# Patient Record
Sex: Male | Born: 1960 | Race: White | Hispanic: No | Marital: Single
Health system: Southern US, Academic
[De-identification: ages and names within clinical notes are randomized; demographics above are authoritative.]

## PROBLEM LIST (undated history)

## (undated) ENCOUNTER — Ambulatory Visit

## (undated) ENCOUNTER — Ambulatory Visit: Payer: PRIVATE HEALTH INSURANCE | Attending: Hematology & Oncology | Primary: Hematology & Oncology

## (undated) ENCOUNTER — Encounter

## (undated) ENCOUNTER — Encounter: Attending: Hematology & Oncology | Primary: Hematology & Oncology

## (undated) ENCOUNTER — Telehealth

## (undated) ENCOUNTER — Ambulatory Visit: Payer: PRIVATE HEALTH INSURANCE

## (undated) ENCOUNTER — Encounter: Payer: PRIVATE HEALTH INSURANCE | Attending: Hematology & Oncology | Primary: Hematology & Oncology

## (undated) ENCOUNTER — Ambulatory Visit
Attending: Pharmacist Clinician (PhC)/ Clinical Pharmacy Specialist | Primary: Pharmacist Clinician (PhC)/ Clinical Pharmacy Specialist

## (undated) DIAGNOSIS — F419 Anxiety disorder, unspecified: Secondary | ICD-10-CM

## (undated) DIAGNOSIS — G4730 Sleep apnea, unspecified: Secondary | ICD-10-CM

## (undated) DIAGNOSIS — K7460 Unspecified cirrhosis of liver: Secondary | ICD-10-CM

## (undated) DIAGNOSIS — E119 Type 2 diabetes mellitus without complications: Secondary | ICD-10-CM

## (undated) DIAGNOSIS — F1910 Other psychoactive substance abuse, uncomplicated: Secondary | ICD-10-CM

## (undated) DIAGNOSIS — I10 Essential (primary) hypertension: Secondary | ICD-10-CM

## (undated) DIAGNOSIS — D649 Anemia, unspecified: Secondary | ICD-10-CM

## (undated) DIAGNOSIS — E079 Disorder of thyroid, unspecified: Secondary | ICD-10-CM

## (undated) DIAGNOSIS — M1990 Unspecified osteoarthritis, unspecified site: Secondary | ICD-10-CM

## (undated) DIAGNOSIS — K759 Inflammatory liver disease, unspecified: Secondary | ICD-10-CM

## (undated) DIAGNOSIS — E039 Hypothyroidism, unspecified: Secondary | ICD-10-CM

## (undated) DIAGNOSIS — I619 Nontraumatic intracerebral hemorrhage, unspecified: Secondary | ICD-10-CM

## (undated) DIAGNOSIS — C801 Malignant (primary) neoplasm, unspecified: Secondary | ICD-10-CM

## (undated) DIAGNOSIS — F32A Depression, unspecified: Secondary | ICD-10-CM

## (undated) DIAGNOSIS — R7303 Prediabetes: Secondary | ICD-10-CM

## (undated) HISTORY — PX: HERNIA REPAIR: SHX51

## (undated) HISTORY — PX: ANKLE SURGERY: SHX546

## (undated) HISTORY — PX: BACK SURGERY: SHX140

---
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
## 2015-11-08 ENCOUNTER — Encounter (HOSPITAL_COMMUNITY): Payer: Self-pay | Admitting: Emergency Medicine

## 2015-11-08 ENCOUNTER — Emergency Department (HOSPITAL_COMMUNITY)
Admission: EM | Admit: 2015-11-08 | Discharge: 2015-11-09 | Disposition: A | Discharge status: Home / Self Care | Payer: Self-pay | Attending: Emergency Medicine | Admitting: Emergency Medicine

## 2015-11-08 DIAGNOSIS — R140 Abdominal distension (gaseous): Secondary | ICD-10-CM | POA: Insufficient documentation

## 2015-11-08 DIAGNOSIS — R109 Unspecified abdominal pain: Secondary | ICD-10-CM

## 2015-11-08 DIAGNOSIS — Z720 Tobacco use: Secondary | ICD-10-CM | POA: Insufficient documentation

## 2015-11-08 DIAGNOSIS — K429 Umbilical hernia without obstruction or gangrene: Secondary | ICD-10-CM | POA: Insufficient documentation

## 2015-11-08 DIAGNOSIS — I10 Essential (primary) hypertension: Secondary | ICD-10-CM | POA: Insufficient documentation

## 2015-11-08 DIAGNOSIS — Z79899 Other long term (current) drug therapy: Secondary | ICD-10-CM | POA: Insufficient documentation

## 2015-11-08 DIAGNOSIS — Z791 Long term (current) use of non-steroidal anti-inflammatories (NSAID): Secondary | ICD-10-CM | POA: Insufficient documentation

## 2015-11-08 DIAGNOSIS — E079 Disorder of thyroid, unspecified: Secondary | ICD-10-CM | POA: Insufficient documentation

## 2015-11-08 DIAGNOSIS — E119 Type 2 diabetes mellitus without complications: Secondary | ICD-10-CM | POA: Insufficient documentation

## 2015-11-08 DIAGNOSIS — K7460 Unspecified cirrhosis of liver: Secondary | ICD-10-CM | POA: Insufficient documentation

## 2015-11-08 DIAGNOSIS — R110 Nausea: Secondary | ICD-10-CM

## 2015-11-08 DIAGNOSIS — R112 Nausea with vomiting, unspecified: Secondary | ICD-10-CM | POA: Insufficient documentation

## 2015-11-08 DIAGNOSIS — R1030 Lower abdominal pain, unspecified: Secondary | ICD-10-CM | POA: Insufficient documentation

## 2015-11-08 HISTORY — DX: Disorder of thyroid, unspecified: E07.9

## 2015-11-08 HISTORY — DX: Type 2 diabetes mellitus without complications: E11.9

## 2015-11-08 HISTORY — DX: Essential (primary) hypertension: I10

## 2015-11-08 LAB — CBC
HCT: 29.4 % — ABNORMAL LOW (ref 39.0–52.0)
Hemoglobin: 9.6 g/dL — ABNORMAL LOW (ref 13.0–17.0)
MCH: 30.1 pg (ref 26.0–34.0)
MCHC: 32.7 g/dL (ref 30.0–36.0)
MCV: 92.2 fL (ref 78.0–100.0)
Platelets: 113 10*3/uL — ABNORMAL LOW (ref 150–400)
RBC: 3.19 MIL/uL — ABNORMAL LOW (ref 4.22–5.81)
RDW: 14.5 % (ref 11.5–15.5)
WBC: 4.2 10*3/uL (ref 4.0–10.5)

## 2015-11-08 LAB — URINALYSIS, ROUTINE W REFLEX MICROSCOPIC
Glucose, UA: NEGATIVE mg/dL
Hgb urine dipstick: NEGATIVE
KETONES UR: 15 mg/dL — AB
LEUKOCYTES UA: NEGATIVE
NITRITE: NEGATIVE
PROTEIN: NEGATIVE mg/dL
Specific Gravity, Urine: 1.028 (ref 1.005–1.030)
Urobilinogen, UA: 1 mg/dL (ref 0.0–1.0)
pH: 5.5 (ref 5.0–8.0)

## 2015-11-08 LAB — COMPREHENSIVE METABOLIC PANEL WITH GFR
ALT: 51 U/L (ref 17–63)
AST: 56 U/L — ABNORMAL HIGH (ref 15–41)
Albumin: 3.5 g/dL (ref 3.5–5.0)
Alkaline Phosphatase: 61 U/L (ref 38–126)
Anion gap: 6 (ref 5–15)
BUN: 30 mg/dL — ABNORMAL HIGH (ref 6–20)
CO2: 25 mmol/L (ref 22–32)
Calcium: 8.4 mg/dL — ABNORMAL LOW (ref 8.9–10.3)
Chloride: 105 mmol/L (ref 101–111)
Creatinine, Ser: 1.65 mg/dL — ABNORMAL HIGH (ref 0.61–1.24)
GFR calc Af Amer: 54 mL/min — ABNORMAL LOW
GFR calc non Af Amer: 46 mL/min — ABNORMAL LOW
Glucose, Bld: 167 mg/dL — ABNORMAL HIGH (ref 65–99)
Potassium: 4.3 mmol/L (ref 3.5–5.1)
Sodium: 136 mmol/L (ref 135–145)
Total Bilirubin: 0.7 mg/dL (ref 0.3–1.2)
Total Protein: 7.6 g/dL (ref 6.5–8.1)

## 2015-11-08 LAB — LIPASE, BLOOD: LIPASE: 59 U/L — AB (ref 11–51)

## 2015-11-08 MED ORDER — ONDANSETRON HCL 4 MG/2ML IJ SOLN
4.0000 mg | Freq: Once | INTRAMUSCULAR | Status: AC
  Administered 2015-11-08: 4 mg via INTRAVENOUS
  Filled 2015-11-08: qty 2

## 2015-11-08 MED ORDER — FENTANYL CITRATE (PF) 100 MCG/2ML IJ SOLN
50.0000 ug | Freq: Once | INTRAMUSCULAR | Status: AC
Start: 2015-11-08 — End: 2015-11-08
  Administered 2015-11-08: 50 ug via INTRAVENOUS
  Filled 2015-11-08: qty 2

## 2015-11-08 MED ORDER — SODIUM CHLORIDE 0.9 % IV BOLUS (SEPSIS)
1000.0000 mL | Freq: Once | INTRAVENOUS | Status: AC
  Administered 2015-11-09: 1000 mL via INTRAVENOUS

## 2015-11-08 MED ORDER — MORPHINE SULFATE (PF) 4 MG/ML IV SOLN
4.0000 mg | INTRAVENOUS | Status: DC | PRN
  Administered 2015-11-09 (×2): 4 mg via INTRAVENOUS
  Filled 2015-11-08: qty 1

## 2015-11-08 MED ORDER — ONDANSETRON HCL 4 MG/2ML IJ SOLN
4.0000 mg | Freq: Four times a day (QID) | INTRAMUSCULAR | Status: DC | PRN
  Administered 2015-11-09 (×2): 4 mg via INTRAVENOUS
  Filled 2015-11-08 (×2): qty 2

## 2015-11-08 NOTE — ED Notes (Signed)
MD at bedside. 

## 2015-11-08 NOTE — ED Provider Notes (Signed)
CSN: [id number]   Arrival date & time 11/08/15 2141  History  By signing my name below, I, Altamease Oiler, attest that this documentation has been prepared under the direction and in the presence of Jola Schmidt, MD. Electronically Signed: Altamease Oiler, ED Scribe. 11/08/2015. 11:59 PM.  Chief Complaint  Patient presents with  . Abdominal Pain    HPI The history is provided by the patient. No language interpreter was used.   David Griffin is a 54 y.o. male with history of DM, HTN, and thyroid disease who presents to the Emergency Department complaining of new and intermittent lower abdominal pain with onset 1 month ago. He describes the pain as "almost like gas pain" but it is not improved after passing gas. The pain is worse with eating and bending at the waist.  Associated symptoms include abdominal bloating that is worse with eating, nausea, 1 episode of emesis today, and a "knot" near the umbilicus. His bowel movements have been regular but he states that it feels as if he does not complete them. Pt denies diarrhea, fever, weight loss. No history of anemia. Pt drinks beer daily.   Past Medical History  Diagnosis Date  . Hypertension   . Diabetes mellitus without complication ([place])   . Thyroid disease     Past Surgical History  Procedure Laterality Date  . Back surgery    . Ankle surgery      No family history on file.  Social History  Substance Use Topics  . Smoking status: Current Every Day Smoker  . Smokeless tobacco: None  . Alcohol Use: Yes     Review of Systems 10 Systems reviewed and all are negative for acute change except as noted in the HPI. Home Medications   Prior to Admission medications   Medication Sig Start Date End Date Taking? Authorizing Provider  ALPRAZolam Duanne Moron) 1 MG tablet Take 1 mg by mouth 2 (two) times daily as needed for anxiety.   Yes Historical Provider, MD  HYDROcodone-acetaminophen (NORCO) 10-325 MG tablet Take 0.5 tablets by mouth  daily as needed for moderate pain.   Yes Historical Provider, MD  levothyroxine (SYNTHROID, LEVOTHROID) 300 MCG tablet Take 300 mcg by mouth daily before breakfast.   Yes Historical Provider, MD  lisinopril-hydrochlorothiazide (PRINZIDE,ZESTORETIC) 20-12.5 MG tablet Take 1 tablet by mouth 2 (two) times daily.   Yes Historical Provider, MD  MAGNESIUM PO Take 1 tablet by mouth daily.   Yes Historical Provider, MD  metFORMIN (GLUCOPHAGE) 500 MG tablet Take 1,000 mg by mouth 2 (two) times daily with a meal.   Yes Historical Provider, MD  Multiple Vitamin (MULTIVITAMIN WITH MINERALS) TABS tablet Take 1 tablet by mouth daily.   Yes Historical Provider, MD  naproxen (NAPROSYN) 500 MG tablet Take 500 mg by mouth 2 (two) times daily with a meal.   Yes Historical Provider, MD  RaNITidine HCl (ACID CONTROL PO) Take 1 tablet by mouth daily.   Yes Historical Provider, MD    Allergies  Review of patient's allergies indicates no known allergies.  Triage Vitals: BP 120/67 mmHg  Pulse 87  Temp(Src) 98.7 F (37.1 C) (Oral)  Resp 16  SpO2 97%  Physical Exam  Constitutional: He is oriented to person, place, and time. He appears well-developed and well-nourished.  HENT:  Head: Normocephalic and atraumatic.  Eyes: EOM are normal.  Neck: Normal range of motion.  Cardiovascular: Normal rate, regular rhythm, normal heart sounds and intact distal pulses.   Pulmonary/Chest: Effort normal and breath  sounds normal. No respiratory distress.  Abdominal: Soft. There is no rebound and no guarding.  Mild lower abdominal tenderness Small reducible umbilical hernia  Musculoskeletal: Normal range of motion.  Neurological: He is alert and oriented to person, place, and time.  Skin: Skin is warm and dry.  Psychiatric: He has a normal mood and affect. Judgment normal.  Nursing note and vitals reviewed.   ED Course  Procedures   DIAGNOSTIC STUDIES: Oxygen Saturation is 97% on RA, normal by my interpretation.     COORDINATION OF CARE: 11:58 PM Discussed treatment plan which includes lab work, CT A/P, IVF, Zofran, and pain management with pt at bedside and pt agreed to plan.  Labs Reviewed  LIPASE, BLOOD - Abnormal; Notable for the following:    Lipase 59 (*)    All other components within normal limits  COMPREHENSIVE METABOLIC PANEL - Abnormal; Notable for the following:    Glucose, Bld 167 (*)    BUN 30 (*)    Creatinine, Ser 1.65 (*)    Calcium 8.4 (*)    AST 56 (*)    GFR calc non Af Amer 46 (*)    GFR calc Af Amer 54 (*)    All other components within normal limits  CBC - Abnormal; Notable for the following:    RBC 3.19 (*)    Hemoglobin 9.6 (*)    HCT 29.4 (*)    Platelets 113 (*)    All other components within normal limits  URINALYSIS, ROUTINE W REFLEX MICROSCOPIC (NOT AT [facility]) - Abnormal; Notable for the following:    Color, Urine AMBER (*)    Bilirubin Urine SMALL (*)    Ketones, ur 15 (*)    All other components within normal limits    Imaging Review Ct Abdomen Pelvis W Contrast  11/09/2015  CLINICAL DATA:  Knot near the umbilibus with sharp stabbing pain for 1 month. Nausea and vomiting. EXAM: CT ABDOMEN AND PELVIS WITH CONTRAST TECHNIQUE: Multidetector CT imaging of the abdomen and pelvis was performed using the standard protocol following bolus administration of intravenous contrast. CONTRAST:  160mL OMNIPAQUE IOHEXOL 300 MG/ML  SOLN COMPARISON:  None. FINDINGS: Lower chest and abdominal wall: Small umbilical hernia containing fat and ascitic fluid. Hepatobiliary: Cirrhotic liver morphology with portal hypertension reflected by splenomegaly and recannulized umbilical vein. Trace perihepatic ascites. No evidence of focal mass lesion.No evidence of biliary obstruction or stone. Pancreas: Unremarkable. Spleen: Enlarged with 15 cm craniocaudal span.  No focal lesion. Adrenals/Urinary Tract: Negative adrenals. No hydronephrosis or stone. Unremarkable bladder. Reproductive:No  pathologic findings. Stomach/Bowel:  No obstruction. No appendicitis. Vascular/Lymphatic: Retroperitoneal edema, likely from portal hypertension. Enlarged lymph nodes in the deep liver drainage, likely reactive. No acute vascular finding. Peritoneal: No ascites or pneumoperitoneum. 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 IMPRESSION: 1. Cirrhosis with portal hypertension. 2. The umbilical knot is a hernia containing fat and ascites. Electronically Signed   By: Monte Fantasia M.D.   On: 11/09/2015 00:41    I personally reviewed and evaluated these images and lab results as a part of my medical decision-making.    MDM   Final diagnoses:  None   Patient is feeling better in the emergency department.  CT scan without any acute pathology.  He does have noted cirrhosis.  The patient reports he does have a history of cirrhosis but is never seen a gastroenterologist never had this worked up any further.  He suspected it was  due to his alcohol abuse and he has sustained from alcohol.  He denies significant use of Tylenol.  No obvious ascites noted on CT to explain his symptoms.  Outpatient GI follow-up as he may benefit from endoscopy and colonoscopy for further evaluation.  This is premorbid functional problem is his pain comes on 4-5 hours after he eats.  Discharge home in good condition.  Primary care follow-up.  He understands to return to the ER for new or worsening symptoms  I personally performed the services described in this documentation, which was scribed in my presence. The recorded information has been reviewed and is accurate.       Jola Schmidt, MD 11/09/15 [phone]

## 2015-11-08 NOTE — ED Notes (Signed)
Pt. reports umbilical pain " knot" onset last month with emesis today , denies fever or diarrhea .

## 2015-11-09 ENCOUNTER — Emergency Department (HOSPITAL_COMMUNITY): Payer: Self-pay

## 2015-11-09 ENCOUNTER — Encounter (HOSPITAL_COMMUNITY): Payer: Self-pay

## 2015-11-09 MED ORDER — ONDANSETRON HCL 4 MG/2ML IJ SOLN: 4.0000 mg | Freq: Once | INTRAMUSCULAR | Status: DC

## 2015-11-09 MED ORDER — IOHEXOL 300 MG/ML  SOLN
100.0000 mL | Freq: Once | INTRAMUSCULAR | Status: AC | PRN
  Administered 2015-11-09: 100 mL via INTRAVENOUS

## 2015-11-09 MED ORDER — ONDANSETRON 8 MG PO TBDP: 8.0000 mg | ORAL_TABLET | Freq: Three times a day (TID) | ORAL | Status: DC | PRN

## 2015-11-09 MED ORDER — PROMETHAZINE HCL 25 MG PO TABS: 25.0000 mg | ORAL_TABLET | Freq: Four times a day (QID) | ORAL | Status: DC | PRN

## 2015-11-09 MED ORDER — MORPHINE SULFATE (PF) 4 MG/ML IV SOLN
4.0000 mg | Freq: Once | INTRAVENOUS | Status: DC
  Filled 2015-11-09: qty 1

## 2015-11-09 NOTE — ED Notes (Signed)
Patient left at this time with all belongings. 

## 2015-11-09 NOTE — Discharge Instructions (Signed)

## 2016-09-03 DIAGNOSIS — K922 Gastrointestinal hemorrhage, unspecified: Secondary | ICD-10-CM

## 2016-09-03 DIAGNOSIS — E039 Hypothyroidism, unspecified: Secondary | ICD-10-CM

## 2016-09-03 DIAGNOSIS — D62 Acute posthemorrhagic anemia: Secondary | ICD-10-CM

## 2016-09-03 DIAGNOSIS — K7460 Unspecified cirrhosis of liver: Secondary | ICD-10-CM

## 2016-09-03 DIAGNOSIS — I10 Essential (primary) hypertension: Secondary | ICD-10-CM

## 2017-07-02 ENCOUNTER — Ambulatory Visit
Admission: RE | Admit: 2017-07-02 | Discharge: 2017-07-02 | Disposition: A | Discharge status: Home / Self Care | Payer: MEDICAID

## 2017-07-02 DIAGNOSIS — K7030 Alcoholic cirrhosis of liver without ascites: Principal | ICD-10-CM

## 2017-08-14 ENCOUNTER — Ambulatory Visit
Admission: RE | Admit: 2017-08-14 | Discharge: 2017-08-14 | Disposition: A | Discharge status: Home / Self Care | Payer: MEDICAID

## 2017-08-14 ENCOUNTER — Ambulatory Visit
Admission: RE | Admit: 2017-08-14 | Discharge: 2017-08-14 | Disposition: A | Discharge status: Home / Self Care | Payer: MEDICAID | Attending: Hematology & Oncology | Admitting: Hematology & Oncology

## 2017-08-14 DIAGNOSIS — C220 Liver cell carcinoma: Principal | ICD-10-CM

## 2017-11-27 ENCOUNTER — Ambulatory Visit
Admission: RE | Admit: 2017-11-27 | Discharge: 2017-11-27 | Disposition: A | Discharge status: Home / Self Care | Payer: MEDICAID

## 2017-11-27 ENCOUNTER — Ambulatory Visit
Admission: RE | Admit: 2017-11-27 | Discharge: 2017-11-27 | Disposition: A | Discharge status: Home / Self Care | Payer: MEDICAID | Attending: Adult Health | Admitting: Adult Health

## 2017-11-27 ENCOUNTER — Ambulatory Visit: Admission: RE | Admit: 2017-11-27 | Discharge: 2017-11-27 | Payer: MEDICAID

## 2017-11-27 DIAGNOSIS — B1920 Unspecified viral hepatitis C without hepatic coma: Secondary | ICD-10-CM

## 2017-11-27 DIAGNOSIS — K709 Alcoholic liver disease, unspecified: Secondary | ICD-10-CM

## 2017-11-27 DIAGNOSIS — Z01818 Encounter for other preprocedural examination: Secondary | ICD-10-CM

## 2017-11-27 DIAGNOSIS — C220 Liver cell carcinoma: Principal | ICD-10-CM

## 2017-11-27 DIAGNOSIS — K7460 Unspecified cirrhosis of liver: Secondary | ICD-10-CM

## 2018-02-26 ENCOUNTER — Encounter: Admit: 2018-02-26 | Discharge: 2018-02-27 | Payer: PRIVATE HEALTH INSURANCE

## 2018-02-26 ENCOUNTER — Encounter: Admit: 2018-02-26 | Discharge: 2018-02-26 | Payer: PRIVATE HEALTH INSURANCE

## 2018-02-26 DIAGNOSIS — C220 Liver cell carcinoma: Principal | ICD-10-CM

## 2018-02-26 DIAGNOSIS — K7460 Unspecified cirrhosis of liver: Secondary | ICD-10-CM

## 2018-02-26 DIAGNOSIS — B1920 Unspecified viral hepatitis C without hepatic coma: Secondary | ICD-10-CM

## 2018-04-12 ENCOUNTER — Ambulatory Visit: Admit: 2018-04-12 | Discharge: 2018-04-13 | Payer: PRIVATE HEALTH INSURANCE

## 2018-04-12 DIAGNOSIS — C220 Liver cell carcinoma: Secondary | ICD-10-CM

## 2018-04-12 DIAGNOSIS — B182 Chronic viral hepatitis C: Principal | ICD-10-CM

## 2018-04-12 DIAGNOSIS — R1033 Periumbilical pain: Secondary | ICD-10-CM

## 2018-04-12 DIAGNOSIS — Z1211 Encounter for screening for malignant neoplasm of colon: Secondary | ICD-10-CM

## 2018-04-27 MED ORDER — GLECAPREVIR 100 MG-PIBRENTASVIR 40 MG TABLET: 3 | tablet | 2 refills | 0 days

## 2018-04-27 MED ORDER — GLECAPREVIR 100 MG-PIBRENTASVIR 40 MG TABLET
ORAL_TABLET | Freq: Every day | ORAL | 2 refills | 0.00000 days | Status: CP
Start: 2018-04-27 — End: 2019-04-27

## 2018-04-27 NOTE — Unmapped (Signed)
Specialty Medication Referral    Medication: Mavyret  Duration: 12 weeks  Diagnosis:   B18.2 Hep C: yes    K74.60 Cirrhosis: yes,   Child Pugh Score if applicable and for Medicaid pts: 6 (A)  Z94.4 Liver Transplant: no  Genotype? 3  HCV RNA: 0,454,098   Date: 04/12/18  Fibrosis score: 4  HIV Co-infection? no  Signs of liver decompensation? HCC (treated)  Previous treatment? no  Non-responder? no  Previous length of therapy _____n/a__months   Previous medications -n/a    Thanks,  Earley Abide

## 2018-04-27 NOTE — Unmapped (Signed)
Per test claim for MAVYRET at the [facility], patient needs Medication Assistance Program for Prior Authorization.

## 2018-05-03 NOTE — Unmapped (Signed)
[facility] Specialty Medication Referral: PA APPROVED    Medication (Brand/Generic): Mavyret    Initial FSI Test Claim completed with resulted information below:  No PA required  Patient ABLE to fill at [facility]  Insurance Company:  [facility]   Anticipated Copay: $5.00   Is anticipated copay with a copay card or grant? Yes    As Co-pay is under $100 defined limit, per policy there will be no further investigation of need for financial assistance at this time unless patient requests. This referral has been communicated to the provider and handed off to the [facility] team for further processing and filling of prescribed medication.   ______________________________________________________________________  Please utilize this referral for viewing purposes as it will serve as the central location for all relevant documentation and updates.

## 2018-05-04 NOTE — Unmapped (Signed)
Initial Counseling for HCV treatment     Planned regimen: Mavyret (glecaprevir/pibrentasvir 100/40 mg) 3 tabs daily x 12 weeks  Planned start date: 05/07/18    Pharmacy: [facility] [phone] option #4    PMH:   Past Medical History:   Diagnosis Date   ??? Alcohol abuse    ??? Anemia    ??? Chronic hepatitis C (CMS-HCC)    ??? Cirrhosis (CMS-HCC)    ??? DM2 (diabetes mellitus, type 2) (CMS-HCC)    ??? Gastric ulcer    ??? HTN (hypertension)    ??? Hypothyroidism    ??? Umbilical hernia      Current Meds: Magnesium, iron supplement, spironolactone, trazodone, propranolol, furosemide, escitalopram, pantoprazole, metformin, gabapentin, CBD oil.    Patient is ready to start Mavyret.    Following topics were discussed during counseling:   Patient Counseling    Counseled the patient on the following:  doses and administration discussed, safe handling, storage, and disposal discussed, possible adverse effects and management discussed, possible drug and prescription drug interactions discussed, possible drug and OTC drug and food interactions discussed, lab monitoring and follow-up discussed, cost of medications and cost implications discussed, adherence and missed doses discussed, pharmacy contact information discussed        1. Indications for medication, dosage and administration.     A. Mavyret (100/40 mg) 3 tablets to take daily with food. Patient plans to take around 2pm after lunch.     2. Common side effects of medications and management strategies. (fatigue, headache)      3. Importance of adherence to regimen, follow-up clinic visits and lab monitoring.   Medication Adherence    Demonstrates understanding of importance of adherence:  yes  Informant:  patient  Patient is at risk for Non-Adherence:  No  Reasons for non-adherence:  no problems identified  Confirmed plan for next specialty medication refill:  delivery by pharmacy       A. Asked patient to call [facility] Kras [phone] to establish start date for treatment and to schedule appointment 4 weeks before starting treatment.      4. Drug-drug interaction.  Drug Interactions    Drug interactions evaluated:  yes  Clinically relevant drug interactions identified:  no       A. Current medications have been reviewed and assessed for possible interaction.    Denies use of herbal medication such as milk thistle or [place] John's wart but uses CBD oil. Recommended to avoid use of CBD oil. Pt in agreement.  Allergies have been verified. Denies alcohol      5. Importance of informing pharmacy and clinic of updated contact information.  Stressed importance of being able to reach over the phone to set up refills. Advised patient to call pharmacy when down to about 7 day supply left to ensure there's no interruption in therapy.  Refill Coordination    Has the Patients' Contact Information Changed:  No  Is the Shipping Address Different:  No       Shipping Information    Delivery Scheduled:  Yes  Delivery Date:  05/06/18  Medications to be Shipped:  MAVYRET       Patient verbalized understanding. Provided contact information for any questions/concerns.       Park Breed, Pharm D., BCPS, BCGP, CPP  [facility]  [address]  [place], [place] [address]  [phone]      May 04, 2018 10:20 AM

## 2018-05-05 MED FILL — MAVYRET/100-40MG/TABS: MAVYRET/100-40MG/TABS | 28 days supply | Qty: 84 | Fill #0

## 2018-05-21 NOTE — Unmapped (Signed)
[facility] Pharm[facility] Refill Coordination Note    Specialty Medication(s) to be Shipped:   Infectious Disease: Mavyret    Other medication(s) to be shipped: N/A     Phillip Carey, DOB: [birth year]  Phone: [phone] (home)   Shipping Address: [address]  [facility] [place] [address]    All above HIPAA information was verified with patient.     Completed refill call assessment today to schedule patient's medication shipment from the [facility] [phone]).       Specialty medication(s) and dose(s) confirmed: Regimen is correct and unchanged.   Changes to medications: Phillip Carey reports no changes reported at this time.  Changes to insurance: No  Questions for the pharmacist: No    The patient will receive an FSI print out for each medication shipped and additional FDA Medication Guides as required.  Patient education from Phillip Carey or Phillip Carey may also be included in the shipment.    DISEASE-SPECIFIC INFORMATION        For Hepatitis C patients:  Treatment start date: 05/11/18  Current treatment week: 1    ADHERENCE     Medication Adherence    Patient reported X missed doses in the last month:  0  Specialty Medication:  MAVYRET  Patient is on additional specialty medications:  No  Patient is on more than two specialty medications:  No  Any gaps in refill history greater than 2 weeks in the last 3 months:  no  Demonstrates understanding of importance of adherence:  yes  Informant:  patient  Confirmed plan for next specialty medication refill:  delivery by pharmacy  Refills needed for supportive medications:  not needed          Refill Coordination    Has the Patients' Contact Information Changed:  No  Is the Shipping Address Different:  No           SHIPPING     Shipping address confirmed in FSI.     Delivery Scheduled: Yes, Expected medication delivery date: 05/28/18 [facility] via UPS or courier.     Phillip Carey   [facility] Specialty Technician

## 2018-05-26 MED FILL — MAVYRET/100-40MG/TABS: MAVYRET/100-40MG/TABS | 28 days supply | Qty: 84 | Fill #1

## 2018-06-01 ENCOUNTER — Encounter
Admit: 2018-06-01 | Discharge: 2018-06-01 | Payer: PRIVATE HEALTH INSURANCE | Attending: Certified Registered" | Primary: Certified Registered"

## 2018-06-01 ENCOUNTER — Ambulatory Visit: Admit: 2018-06-01 | Discharge: 2018-06-01 | Payer: PRIVATE HEALTH INSURANCE

## 2018-06-01 DIAGNOSIS — R109 Unspecified abdominal pain: Principal | ICD-10-CM

## 2018-06-04 ENCOUNTER — Encounter
Admit: 2018-06-04 | Discharge: 2018-06-04 | Payer: PRIVATE HEALTH INSURANCE | Attending: Hematology & Oncology | Primary: Hematology & Oncology

## 2018-06-04 ENCOUNTER — Encounter: Admit: 2018-06-04 | Discharge: 2018-06-04 | Payer: PRIVATE HEALTH INSURANCE

## 2018-06-04 DIAGNOSIS — C220 Liver cell carcinoma: Principal | ICD-10-CM

## 2018-06-04 LAB — COMPREHENSIVE METABOLIC PANEL
ALBUMIN: 3.8 g/dL (ref 3.5–5.0)
ALKALINE PHOSPHATASE: 56 U/L (ref 38–126)
ALT (SGPT): 25 U/L (ref 19–72)
ANION GAP: 7 mmol/L — ABNORMAL LOW (ref 9–15)
AST (SGOT): 30 U/L (ref 19–55)
BILIRUBIN TOTAL: 1 mg/dL (ref 0.0–1.2)
BLOOD UREA NITROGEN: 13 mg/dL (ref 7–21)
BUN / CREAT RATIO: 15
CHLORIDE: 104 mmol/L (ref 98–107)
CO2: 24 mmol/L (ref 22.0–30.0)
CREATININE: 0.88 mg/dL (ref 0.70–1.30)
EGFR MDRD AF AMER: >=60 mL/min/{1.73_m2} (ref >=60)
EGFR MDRD NON AF AMER: >=60 mL/min/{1.73_m2} (ref >=60)
GLUCOSE RANDOM: 105 mg/dL (ref 65–179)
POTASSIUM: 4.9 mmol/L (ref 3.5–5.0)
SODIUM: 135 mmol/L (ref 135–145)

## 2018-06-04 LAB — CBC W/ AUTO DIFF
BASOPHILS ABSOLUTE COUNT: 0 10*9/L (ref 0.0–0.1)
BASOPHILS RELATIVE PERCENT: 0.3 %
EOSINOPHILS ABSOLUTE COUNT: 0.1 10*9/L (ref 0.0–0.4)
EOSINOPHILS RELATIVE PERCENT: 3.3 %
HEMOGLOBIN: 12.9 g/dL — ABNORMAL LOW (ref 13.5–17.5)
LARGE UNSTAINED CELLS: 2 % (ref 0–4)
LYMPHOCYTES ABSOLUTE COUNT: 0.5 10*9/L — ABNORMAL LOW (ref 1.5–5.0)
LYMPHOCYTES RELATIVE PERCENT: 18.8 %
MEAN CORPUSCULAR VOLUME: 90.5 fL (ref 80.0–100.0)
MEAN PLATELET VOLUME: 11 fL — ABNORMAL HIGH (ref 7.0–10.0)
MONOCYTES ABSOLUTE COUNT: 0.2 10*9/L (ref 0.2–0.8)
MONOCYTES RELATIVE PERCENT: 7.1 %
NEUTROPHILS ABSOLUTE COUNT: 1.7 10*9/L — ABNORMAL LOW (ref 2.0–7.5)
NEUTROPHILS RELATIVE PERCENT: 68.9 %
PLATELET COUNT: 66 10*9/L — ABNORMAL LOW (ref 150–440)
RED BLOOD CELL COUNT: 4.09 10*12/L — ABNORMAL LOW (ref 4.50–5.90)
RED CELL DISTRIBUTION WIDTH: 14.7 % (ref 12.0–15.0)
WBC ADJUSTED: 2.5 10*9/L — ABNORMAL LOW (ref 4.5–11.0)

## 2018-06-04 LAB — EOSINOPHILS ABSOLUTE COUNT: Lab: 0.1

## 2018-06-04 LAB — CALCIUM: Calcium:MCnc:Pt:Ser/Plas:Qn:: 8.9

## 2018-06-04 NOTE — Unmapped (Signed)
PRIMARY CARE PROVIDER:  Dema Severin, NP  [phone] [address] [facility]  [facility] [place] [address]    CONSULTING PROVIDERS  Dr. Claretta Fraise, VIR  Vallery Sa, ANP, [facility]  Dr. Darrelyn Hillock, [facility]iation [facility]  __________________________________________________________________    CANCER HISTORY  1. Hepatocellular Carcinoma, BCLC Stage A, Georgiann Cocker A (3.2 cm LR5)  2. 12/10/2016 - TACE seg 7   3. 02/2017 - CK seg 7  __________________________________________________________________    ASSESSMENT  1. Hepatocellular Carcinoma, treated without viable disease   2. Hepatitis C, treatment naive  3. HCV/EtOH Cirrhosis, compensated  4. Ventral hernia, without incarcertation  5. ECOG PS 1    RECOMMENDATIONS  1. Surveillance  2. RTC in 3 months with labs, MRI and visit. If remains with controlled disease will spread scans to 6 months    DISCUSSION  Reviewed good MRI results. Plan as above for ongoing surveillance to detect recurrence of current and development of new lesions.   ______________________________________________________________________  HISTORY     Phillip Carey is seen today at the [facility] for ongoing management regarding hepatocellular carcinoma in the setting of hepatitis C and alcohol cirrhosis.    Feeling prety well. Has been having some abd pain just posterior to his umbilicus following eating. Rather sharp, feels like twisting knife. Has been looking and doesn't see any bowel sticking out. He otherwise has been fine. Energy ok. specifically denies increasing abdominal girth, le edema, confusion/encephalopathy, hematemesis or melena     MEDICAL HISTORY  1. Hepatitis C, treatment with MAVYRET started 05/11/18  2. Cirrhosis  3. H/o gastric ulcer/bleeding secondary to NSAIDS  4. Umbilical hernia repair due to SBO, right inguinal hernia repair - 2017  5. Type 2 Diabetes  6. Hypertension  7. Hypothyroidism  8. Surgery to right ankle, chronic pain    Interval med history: work up of abd pain, normal EGD and colonoscopy. HCV treatment initiation       Allergies   Allergen Reactions   ??? Phenothiazines Nausea Only   ??? Promethazine Nausea Only     Medications reviewed in the EMR    SOCIAL HISTORY  Prior: Divorced x 3.  He has 3 children.  Lives alone, brother and sister nearby.  +tobacco 1PPD x 35 years.  H/o ETOH a case of beer daily x 25 years, quit a year ago, h/o 5 DUIs and currently does not have a drivers license.  H/o IV cocaine use 15 years ago.    He works as an International aid/development worker at night.      Today:   He comes alone     REVIEW OF SYSTEMS  The remainder of a comprehensive 10 systems review is negative.    PHYSICAL EXAM  Vitals:    06/04/18 1141   BP: 118/65   Pulse: (!) 48   Resp: 16   SpO2: 98%     GENERAL: well developed, well nourished, in no distress  PSYCH: full and appropriate range of affect with good insight and judgement  HEENT: NCAT, pupils equal, sclerae anicteric, OP clear without erythema, exudate or ulceration; neck supple without thyromegaly or masses;   LYMPH: no cervical, supraclavicular, periumbilical adenopathy  PULM: normal resp rate, clear bilaterally without wheezes, rhonchi, rales  COR: regular without murmur, rub, gallop  GI: abdomen soft, mildly tender, less distention, no palpable hepatosplenomegaly. There are small abd wall defects without hernia  EXT: warm and well perfused, no edema  SKIN:  No rashes    OBJECTIVE DATA  Labs reviewed in emr    RADIOLOGY RESULTS (scans are personally reviewed by me)  MRI (impression is mine): no evidence for active disease, no early precancerous

## 2018-06-04 NOTE — Unmapped (Signed)
Labs drawn and sent for analysis. Care provided by T Loney Hering

## 2018-06-09 NOTE — Unmapped (Signed)
Patient is receiving his last fill of mavyret with [place]  Ins will pay on 6/19 so he'll receive drug on 6/20  No side effects reported-having same GI issues as he had before he started    [facility] Refill Coordination Note    Specialty Medication(s) to be Shipped:   Infectious Disease: Mavyret    Other medication(s) to be shipped: n/a     Dola Argyle, DOB: [birth year]  Phone: [phone] (home)   Shipping Address: [address]  [facility] [place] [address]    All above HIPAA information was verified with patient.     Completed refill call assessment today to schedule patient's medication shipment from the [facility] [phone]).       Specialty medication(s) and dose(s) confirmed: Regimen is correct and unchanged.   Changes to medications: [name] reports no changes reported at this time.  Changes to insurance: No  Questions for the pharmacist: No    The patient will receive an FSI print out for each medication shipped and additional FDA Medication Guides as required.  Patient education from [place] or Robet Leu may also be included in the shipment.    DISEASE-SPECIFIC INFORMATION        For Hepatitis C patients:  Treatment start date: 05/11/18  Current treatment week: 4    ADHERENCE     Medication Adherence    Patient reported X missed doses in the last month:  0  Specialty Medication:  MAVYRET  Patient is on additional specialty medications:  No  Patient is on more than two specialty medications:  No  Any gaps in refill history greater than 2 weeks in the last 3 months:  no  Demonstrates understanding of importance of adherence:  yes  Informant:  patient  Reliability of informant:  reliable  Confirmed plan for next specialty medication refill:  delivery by pharmacy  Refills needed for supportive medications:  not needed          Refill Coordination    Has the Patients' Contact Information Changed:  No  Is the Shipping Address Different:  No         SHIPPING     Shipping address confirmed in FSI.     Delivery Scheduled: Yes, Expected medication delivery date: 06/17/18 via UPS or courier.     [name]   [facility] Specialty Technician

## 2018-06-10 ENCOUNTER — Ambulatory Visit: Admit: 2018-06-10 | Discharge: 2018-06-11 | Payer: PRIVATE HEALTH INSURANCE

## 2018-06-10 DIAGNOSIS — K7460 Unspecified cirrhosis of liver: Principal | ICD-10-CM

## 2018-06-10 DIAGNOSIS — B182 Chronic viral hepatitis C: Secondary | ICD-10-CM

## 2018-06-10 DIAGNOSIS — K7030 Alcoholic cirrhosis of liver without ascites: Secondary | ICD-10-CM

## 2018-06-10 LAB — CBC
HEMATOCRIT: 39.4 % — ABNORMAL LOW (ref 41.0–53.0)
HEMOGLOBIN: 13.3 g/dL — ABNORMAL LOW (ref 13.5–17.5)
MEAN CORPUSCULAR HEMOGLOBIN CONC: 33.8 g/dL (ref 31.0–37.0)
MEAN CORPUSCULAR HEMOGLOBIN: 31.1 pg (ref 26.0–34.0)
MEAN CORPUSCULAR VOLUME: 92.1 fL (ref 80.0–100.0)
MEAN PLATELET VOLUME: 10.2 fL — ABNORMAL HIGH (ref 7.0–10.0)
PLATELET COUNT: 80 10*9/L — ABNORMAL LOW (ref 150–440)
RED BLOOD CELL COUNT: 4.28 10*12/L — ABNORMAL LOW (ref 4.50–5.90)
RED CELL DISTRIBUTION WIDTH: 14.8 % (ref 12.0–15.0)

## 2018-06-10 LAB — COMPREHENSIVE METABOLIC PANEL
ALBUMIN: 4 g/dL (ref 3.5–5.0)
ALKALINE PHOSPHATASE: 61 U/L (ref 38–126)
ANION GAP: 9 mmol/L (ref 9–15)
BILIRUBIN TOTAL: 1 mg/dL (ref 0.0–1.2)
BLOOD UREA NITROGEN: 27 mg/dL — ABNORMAL HIGH (ref 7–21)
BUN / CREAT RATIO: 18
CALCIUM: 9.8 mg/dL (ref 8.5–10.2)
CHLORIDE: 103 mmol/L (ref 98–107)
CREATININE: 1.52 mg/dL — ABNORMAL HIGH (ref 0.70–1.30)
EGFR MDRD AF AMER: 58 mL/min/{1.73_m2} — ABNORMAL LOW (ref >=60)
EGFR MDRD NON AF AMER: 48 mL/min/{1.73_m2} — ABNORMAL LOW (ref >=60)
GLUCOSE RANDOM: 104 mg/dL — ABNORMAL HIGH (ref 65–99)
POTASSIUM: 4.9 mmol/L (ref 3.5–5.0)
PROTEIN TOTAL: 7.7 g/dL (ref 6.5–8.3)
SODIUM: 140 mmol/L (ref 135–145)

## 2018-06-10 LAB — GAMMA GLUTAMYL TRANSFERASE: Gamma glutamyl transferase:CCnc:Pt:Ser/Plas:Qn:: 31

## 2018-06-10 LAB — AFP-TUMOR MARKER: Alpha-1-Fetoprotein.tumor marker:MCnc:Pt:Ser/Plas:Qn:: 4

## 2018-06-10 LAB — INR: Lab: 1.1

## 2018-06-10 LAB — MEAN PLATELET VOLUME: Lab: 10.2 — ABNORMAL HIGH

## 2018-06-10 LAB — SODIUM: Sodium:SCnc:Pt:Ser/Plas:Qn:: 140

## 2018-06-10 NOTE — Unmapped (Signed)
[facility], [place]      Referring Provider:  Pcp Emmit Pomfret None  [place], [place] [address]    Primary Care Provider:  Dema Severin, NP            Reason for Visit: Phillip Carey(DOB: Jul 11, 1961) is a 57 y.o. male who returns for cirrhosis and HCC with HCV    Problem List:    has Hepatocellular carcinoma (CMS-HCC); Diabetic peripheral neuropathy (CMS-HCC); and Diabetic Charc??t's arthropathy (CMS-HCC) on their problem list.     Assessment:      Phillip Carey is a 56yo with cirrhosis from a history of HCV/ETOH. In addition he had HCC recently treated with CK.   The patient is a good physical candidate for liver transplant but lack substance abuse counseling and continues to smoke without motivation to quit. He could be a little better with a low sodium diet, but otherwise his liver function remains intact.   MRI from last week shows no viable lesion. Tolerating Mavyret, well.   Still having abd pain, for 10 months, coincides with iron use, HGB robust, will stop iron.  A1C 4.8, needs to discuss stopping metformin with PCP.         Plan:       -This patient was reviewed with Dr. Ruffin Frederick  -HCV RNA,   -Needs to stop smoking, understands this is also a program requirement.   -2G sodium diet  -follow-up with Dr Forbes Cellar, repeat MRI in 3 months  -Stop iron  -RTC at SVR week 12.           CHIEF COMPLAINT: I'm here for my liver    History of Present Illness: This is a 57 y.o. year old male with history of treatment naive hepatitis C/EtOH cirrhosis who is seen in follow-up for HCC, cirrhosis. He is accompanied by a friend today.  He was told that he had hepatitis C but has never been treated. He reports history of intravenous cocaine use about 15 years ago but denies sharing needles or using dirty needles. He also has 3 non-sterile tattoos and 1 sterile ear piercing. He had a blood transfusion recently but none before the year of 1992. He has not had any sexual partners with known HCV. He was also told that his drinking contributed to his liver disease as he drank 1 case of beer daily for over 25 years. He quit drinking alcohol 15 months ago and has not had any alcohol since that time. Has had 5 DUIs in the past and does not currently have his driver's license. Continues to smoke.   ??  He did not have a doctor for many years due to lack of health insurance but recently established care within the last few months with Dr. Charm Barges. His cirrhosis has been complicated by presence of ascites and lower extremity edema, both of which have been managed with diuretics - he is currently on Lasix 20 mg daily and spironolactone 100 mg daily. He has never had a LVP. He was admitted to the hospital in 08/2016 for coffee ground emesis followed by hematemesis and melena, was found to have non-bleeding Gr1 EV as well as gastric ulcer treated with Endoclips with resolution of bleeding. Bleeding ulcer was felt to be secondary to NSAIDs as he had been taking naproxen frequently for ankle pain. Remains on PPI therapy and off NSAIDs without recurrent of bleeding.Underwent emergent umbilical hernia repair due to SBO as well as right inguinal hernia repair shortly thereafter.  When  he had SBO, he had CT Abd/Pelvis which showed 2.7 x 3.4 cm nodular lesion in dome of liver. Then had follow up MRI with lesion in posterior dome of liver measuring 3.6 cm concerning for HCC, this was initially TACE'd but residual noted then treated with Cyberkife. MRI for 06/04/18 non viable lesion.   ??  Denies history of scleral icterus, jaundice, or confusion. No episodes of GI bleed prior to recent admission. No history of colonoscopy. He does have occasional RLQ abdominal pain with nausea, usually in the AM, ongoing for 10 months.  . Has had over 100 lb weight loss in the past year after stopping drinking and getting sick. He has chronic right ankle pain and left shoulder pain as believes that he tore his rotator cuff previously.  ??      Social History:    Social History Socioeconomic History   ??? Marital status: Single     Spouse name: Not on file   ??? Number of children: Not on file   ??? Years of education: Not on file   ??? Highest education level: Not on file   Occupational History   ??? Not on file   Social Needs   ??? Financial resource strain: Not on file   ??? Food insecurity:     Worry: Not on file     Inability: Not on file   ??? Transportation needs:     Medical: Not on file     Non-medical: Not on file   Tobacco Use   ??? Smoking status: Current Every Day Smoker     Packs/day: 1.25     Years: 33.00     Pack years: 41.25     Types: Cigarettes   ??? Smokeless tobacco: Never Used   ??? Tobacco comment: Never given it a thought about quitting smoking   Substance and Sexual Activity   ??? Alcohol use: No   ??? Drug use: Yes     Types: Marijuana   ??? Sexual activity: Not on file   Lifestyle   ??? Physical activity:     Days per week: Not on file     Minutes per session: Not on file   ??? Stress: Not on file   Relationships   ??? Social connections:     Talks on phone: Not on file     Gets together: Not on file     Attends religious service: Not on file     Active member of club or organization: Not on file     Attends meetings of clubs or organizations: Not on file     Relationship status: Not on file   Other Topics Concern   ??? Not on file   Social History Narrative   ??? Not on file         Medications:      Current Outpatient Medications:   ???  escitalopram oxalate (LEXAPRO) 10 MG tablet, Take 10 mg by mouth daily., Disp: , Rfl:   ???  ferrous sulfate 325 (65 FE) MG tablet, Take 65 mg by mouth three (3) times a day (at 6am, noon and 6pm). , Disp: , Rfl:   ???  furosemide (LASIX) 20 MG tablet, Take 20 mg by mouth daily., Disp: , Rfl:   ???  gabapentin (NEURONTIN) 300 MG capsule, Take 300 mg by mouth Three (3) times a day., Disp: , Rfl:   ???  glecaprevir-pibrentasvir (MAVYRET) 100-40 mg tablet, Take 3 tablets by mouth daily. Take with food.,  Disp: 1 4-week carton, Rfl: 2  ???  levothyroxine (SYNTHROID, LEVOTHROID) 300 MCG tablet, Take 300 mcg by mouth daily., Disp: , Rfl:   ???  magnesium 250 mg Tab, Take by mouth nightly. , Disp: , Rfl:   ???  metFORMIN (GLUCOPHAGE) 1000 MG tablet, Take 1,000 mg by mouth 2 (two) times a day with meals., Disp: , Rfl:   ???  multivitamin with minerals tablet, Take 1 tablet by mouth daily., Disp: , Rfl:   ???  pantoprazole (PROTONIX) 40 MG tablet, Take 40 mg by mouth Two (2) times a day., Disp: , Rfl:   ???  propranolol (INDERAL) 10 MG tablet, Take 10 mg by mouth Three (3) times a day., Disp: , Rfl:   ???  spironolactone (ALDACTONE) 100 MG tablet, Take 100 mg by mouth daily., Disp: , Rfl:   ???  traZODone (DESYREL) 100 MG tablet, Take 50 mg by mouth nightly. , Disp: , Rfl:   ???  NON FORMULARY, nightly., Disp: , Rfl:       Vital Signs:     BP 109/62  - Pulse (!) 48  - Temp 36.4 ??C (97.6 ??F) (Temporal)  - Wt 83.9 kg (184 lb 14.4 oz)  - SpO2 98%  - BMI 23.11 kg/m??   Body mass index is 23.11 kg/m??.    Physical Exam:    Normal comprehensive exam:      Constitutional:   Alert, oriented x 3, no acute distress, well nourished   Mental Status:   Thought organized, appropriate affect, normal fluent speech.   HEENT:   PEERL, conjunctiva clear, anicteric, oropharynx clear, neck supple, no LAD.   Respiratory: Clear to auscultation, and percussion to the bases, unlabored breathing.     Cardiac: Regular rate and rhythm normal S1 and S2, no murmur.      Abdomen: Soft, non-distended, non-tender, no organomegaly or masses. Large skin panus     Perianal/Rectal Exam Not performed.     Extremities:   Trace ankle edema, well perfused.   Musculoskeletal: No joint swelling or tenderness noted, no deformities.     Skin: No rashes, jaundice or skin lesions noted.     Neuro: No focal deficits.          Diagnostic Studies:  I have reviewed all pertinent diagnostic studies, including:    GI Procedures  reviewed    Radiographic studies  Mri Abdomen W Wo Contrast    Result Date: 06/04/2018  EXAM: MRI ABDOMEN W WO CONTRAST DATE: 06/04/2018 10:33 AM ACCESSION: [id number] UN DICTATED: 06/04/2018 10:40 AM INTERPRETATION LOCATION: [facility]     CLINICAL INDICATION: 57 years old Male with HCC, eval tx response  - C22.0 - Hepatocellular carcinoma (CMS - HCC)      COMPARISON: MRI 02/26/2018, and earlier     TECHNIQUE: MRI of the abdomen was obtained with and without IV contrast. Multisequence, multiplanar images were obtained.       FINDINGS: LOWER CHEST: Unremarkable.     ABDOMEN/PELVIS:     HEPATOBILIARY: Cirrhotic hepatic morphology with patchy areas of fibrosis, a nodular liver contour, and left hepatic lobe and caudate hypertrophy.     Reference hepatic lesions: - Hepatic segment 7/8 (right hepatic dome) treated lesion measures approximately 1.0 x 1.0 cm (14:15), previously 1.3 x 0.8 cm. Surrounding T1 hypointensity, mild T2 hyperintense intensity, and surrounding patchy enhancement are similar to prior imaging and secondary to radiation treatment. Susceptibility artifact redemonstrated, consistent with fiducial markers. LR-TR nonviable.     - No new  focal hepatic lesions are identified.     No biliary ductal dilatation. Gallbladder demonstrates mild gallbladder wall thickening, unchanged and likely secondary to underlying hepatic parenchymal disease.     PANCREAS: Unchanged answer loop within the pancreatic head. No pancreatic ductal dilatation. SPLEEN: Splenomegaly measuring up to 15.9 cm in craniocaudal dimension. ADRENAL GLANDS: Unremarkable. KIDNEYS/URETERS: Small renal cysts, unchanged. No enhancing renal lesion. No hydronephrosis. BOWEL/PERITONEUM/RETROPERITONEUM: No bowel obstruction. No acute inflammatory process. Trace ascites. VASCULATURE: Abdominal aorta within normal limits for patient's age. The portal, splenic, superior mesenteric, and hepatic veins are patent. Unremarkable inferior vena cava. Small caliber gastroesophageal and perisplenic varices. Recanalization of the umbilical vein. LYMPH NODES: No adenopathy.     BONES/SOFT TISSUES: Unremarkable.         1. Hepatic cirrhosis with sequelae of portal hypertension including trace ascites, splenomegaly, and upper abdominal varices. 2. LR-TR Nonviable lesion in hepatic segment seven. No new hepatic lesions identified. ______________________     Linus Galas 5 = Definitely hepatocellular carcinoma (concordant with OPTN 5) LI-RADS 4 = Probably hepatocellular carcinoma LI-RADS 3 = Indeterminate LI-RADS 2 = Probably benign LI-RADS 1 = Definitely benign     NOTE:  The LI-RADS / OPTN classification of liver lesions has been adopted to standardize CT and MRI scan reporting in patients at risk for hepatocellular carcinoma. The imaging criteria for definite hepatocellular carcinoma are concordant for the LI-RADS and OPTN systems. LI-RADS criteria and documentation are available online at https://sanders.org/.  This report utilizes LI-RADS version 2018.    Laboratory results         Results for orders placed or performed in visit on 06/04/18   Comprehensive Metabolic Panel   Result Value Ref Range    Sodium 135 135 - 145 mmol/L    Potassium 4.9 3.5 - 5.0 mmol/L    Chloride 104 98 - 107 mmol/L    CO2 24.0 22.0 - 30.0 mmol/L    BUN 13 7 - 21 mg/dL    Creatinine 1.61 0.96 - 1.30 mg/dL    BUN/Creatinine Ratio 15     EGFR MDRD Non Af Amer >=60 >=60 mL/min/1.25m2    EGFR MDRD Af Amer >=60 >=60 mL/min/1.24m2    Anion Gap 7 (L) 9 - 15 mmol/L    Glucose 105 65 - 179 mg/dL    Calcium 8.9 8.5 - 04.5 mg/dL    Albumin 3.8 3.5 - 5.0 g/dL    Total Protein 7.1 6.5 - 8.3 g/dL    Total Bilirubin 1.0 0.0 - 1.2 mg/dL    AST 30 19 - 55 U/L    ALT 25 19 - 72 U/L    Alkaline Phosphatase 56 38 - 126 U/L   CBC w/ Differential   Result Value Ref Range    WBC 2.5 (L) 4.5 - 11.0 10*9/L    RBC 4.09 (L) 4.50 - 5.90 10*12/L    HGB 12.9 (L) 13.5 - 17.5 g/dL    HCT 40.9 (L) 81.1 - 53.0 %    MCV 90.5 80.0 - 100.0 fL    MCH 31.4 26.0 - 34.0 pg    MCHC 34.7 31.0 - 37.0 g/dL    RDW 91.4 78.2 - 95.6 %    MPV 11.0 (H) 7.0 - 10.0 fL    Platelet 66 (L) 150 - 440 10*9/L    Neutrophils % 68.9 %    Lymphocytes % 18.8 %    Monocytes % 7.1 %    Eosinophils % 3.3 %    Basophils % 0.3 %  Absolute Neutrophils 1.7 (L) 2.0 - 7.5 10*9/L    Absolute Lymphocytes 0.5 (L) 1.5 - 5.0 10*9/L    Absolute Monocytes 0.2 0.2 - 0.8 10*9/L    Absolute Eosinophils 0.1 0.0 - 0.4 10*9/L    Absolute Basophils 0.0 0.0 - 0.1 10*9/L    Large Unstained Cells 2 0 - 4 %

## 2018-06-10 NOTE — Unmapped (Addendum)
Stop the iron.       Talk to PCP about your Metformin      See you back in 20 weeks.

## 2018-06-14 LAB — HEPATITIS C RNA, QUANTITATIVE, PCR: HCV RNA(IU): 41 [IU]/mL — ABNORMAL HIGH (ref <=0)

## 2018-06-14 LAB — HCV RNA COMMENT: Lab: 0

## 2018-06-16 MED FILL — MAVYRET/100-40MG/TABS: MAVYRET/100-40MG/TABS | 28 days supply | Qty: 84 | Fill #2

## 2018-07-19 NOTE — Unmapped (Signed)
Follow-Up Counseling for HCV Treatment      Regimen: Mavyret (glecaprevir/pibrentasvir 100/40 mg)  x 12 weeks  Start Date: 05/11/18  Completed Treatment Week #9    Pharmacy: [facility] [phone]     Following topics were reviewed during the phone call:  Patient Counseling    Counseled the patient on the following:  doses and administration discussed, possible adverse effects and management discussed, possible drug and prescription drug interactions discussed, possible drug and OTC drug and food interactions discussed, lab monitoring and follow-up discussed, therapeutic rationale discussed, adherence and missed doses discussed, pharmacy contact information discussed         1. Medication administration - Takes Mavyret 3 tabs every day after lunch at 2pm. Pt takes with his other medications.     2. Importance of adherence -   Medication Adherence    Patient reported X missed doses in the last month:  0  Specialty Medication:  Mavyret  Patient is on additional specialty medications:  No  Patient is on more than two specialty medications:  No  Any gaps in refill history greater than 2 weeks in the last 3 months:  no  Demonstrates understanding of importance of adherence:  yes  Informant:  patient  Reliability of informant:  fairly reliable  Provider-estimated medication adherence level:  90-100%  Patient is at risk for Non-Adherence:  Yes  The following intervention(s) were discussed with the patient:  Alarm Clock, Cell phone, Daily routines  Reasons for non-adherence:  patient forgets  Adherence tools used:  medication list, alarm, cell phone   Other adherence tool:  takes with his other medication        Pill count over the phone revealed #17 day supply which is inappropriate. Count if over by 2 doses with start date of 05/11/18 pt should have 15 day supply. Pt reports only missing one dose that he is aware of. Stressed the importance of setting all of his medication together as a visual, and setting phone alarm to help him remember to take his medication.     3. Side effects - Pt denies any side effects.   Adverse Effects    *All other systems reviewed and are negative         4. Drug-drug interaction - Pt denies starting any new medications and denies any alcohol use.   Drug Interactions    Drug interactions evaluated:  yes  Clinically relevant drug interactions identified:  no         5. Follow up - Has follow up appointment scheduled in HCV treatment clinic on October 18th at 0820 with Vallery Sa, ANP. Informed patient that there is still a small chance of relapse after finishing the treatment. Stressed importance of follow up 3 months post treatment to assess for cure.      All questions were answered.      Vertell Limber RN, [facility]   Pharmacy Adult GI Medicine  [facility]  [address]   [place], [place] [address]  [phone]    July 19, 2018 10:46 AM

## 2018-09-10 ENCOUNTER — Encounter: Admit: 2018-09-10 | Discharge: 2018-09-10 | Payer: PRIVATE HEALTH INSURANCE

## 2018-09-10 DIAGNOSIS — C220 Liver cell carcinoma: Principal | ICD-10-CM

## 2018-09-10 DIAGNOSIS — B1920 Unspecified viral hepatitis C without hepatic coma: Secondary | ICD-10-CM

## 2018-10-15 ENCOUNTER — Encounter: Admit: 2018-10-15 | Discharge: 2018-10-16 | Payer: PRIVATE HEALTH INSURANCE

## 2018-10-15 DIAGNOSIS — C220 Liver cell carcinoma: Secondary | ICD-10-CM

## 2018-10-15 DIAGNOSIS — K7460 Unspecified cirrhosis of liver: Principal | ICD-10-CM

## 2018-10-15 DIAGNOSIS — B182 Chronic viral hepatitis C: Secondary | ICD-10-CM

## 2018-10-15 MED ORDER — MIRTAZAPINE 7.5 MG TABLET
ORAL_TABLET | Freq: Every evening | ORAL | 4 refills | 0 days | Status: CP
Start: 2018-10-15 — End: ?

## 2019-03-30 DIAGNOSIS — Z1231 Encounter for screening mammogram for malignant neoplasm of breast: Secondary | ICD-10-CM | POA: Diagnosis not present

## 2019-03-30 DIAGNOSIS — B182 Chronic viral hepatitis C: Secondary | ICD-10-CM | POA: Diagnosis not present

## 2019-03-30 DIAGNOSIS — Z23 Encounter for immunization: Secondary | ICD-10-CM | POA: Diagnosis not present

## 2019-03-31 DIAGNOSIS — E7800 Pure hypercholesterolemia, unspecified: Secondary | ICD-10-CM | POA: Diagnosis not present

## 2019-03-31 DIAGNOSIS — R112 Nausea with vomiting, unspecified: Secondary | ICD-10-CM | POA: Diagnosis not present

## 2019-03-31 DIAGNOSIS — Z23 Encounter for immunization: Secondary | ICD-10-CM | POA: Diagnosis not present

## 2019-03-31 DIAGNOSIS — G4733 Obstructive sleep apnea (adult) (pediatric): Secondary | ICD-10-CM | POA: Diagnosis not present

## 2019-03-31 DIAGNOSIS — E1165 Type 2 diabetes mellitus with hyperglycemia: Secondary | ICD-10-CM | POA: Diagnosis not present

## 2019-03-31 DIAGNOSIS — B0052 Herpesviral keratitis: Secondary | ICD-10-CM | POA: Diagnosis not present

## 2019-03-31 DIAGNOSIS — C9000 Multiple myeloma not having achieved remission: Secondary | ICD-10-CM | POA: Diagnosis not present

## 2019-03-31 DIAGNOSIS — Z9484 Stem cells transplant status: Secondary | ICD-10-CM | POA: Diagnosis not present

## 2019-03-31 DIAGNOSIS — F17210 Nicotine dependence, cigarettes, uncomplicated: Secondary | ICD-10-CM | POA: Diagnosis not present

## 2019-03-31 DIAGNOSIS — C220 Liver cell carcinoma: Secondary | ICD-10-CM | POA: Diagnosis not present

## 2019-03-31 DIAGNOSIS — Z20828 Contact with and (suspected) exposure to other viral communicable diseases: Secondary | ICD-10-CM | POA: Diagnosis not present

## 2019-03-31 DIAGNOSIS — Z95828 Presence of other vascular implants and grafts: Secondary | ICD-10-CM | POA: Diagnosis not present

## 2019-03-31 DIAGNOSIS — R590 Localized enlarged lymph nodes: Secondary | ICD-10-CM | POA: Diagnosis not present

## 2019-03-31 DIAGNOSIS — H2511 Age-related nuclear cataract, right eye: Secondary | ICD-10-CM | POA: Diagnosis not present

## 2019-03-31 DIAGNOSIS — R161 Splenomegaly, not elsewhere classified: Secondary | ICD-10-CM | POA: Diagnosis not present

## 2019-03-31 DIAGNOSIS — R3129 Other microscopic hematuria: Secondary | ICD-10-CM | POA: Diagnosis not present

## 2019-03-31 DIAGNOSIS — R079 Chest pain, unspecified: Secondary | ICD-10-CM | POA: Diagnosis not present

## 2019-03-31 DIAGNOSIS — J449 Chronic obstructive pulmonary disease, unspecified: Secondary | ICD-10-CM | POA: Diagnosis not present

## 2019-03-31 DIAGNOSIS — J9611 Chronic respiratory failure with hypoxia: Secondary | ICD-10-CM | POA: Diagnosis not present

## 2019-03-31 DIAGNOSIS — R042 Hemoptysis: Secondary | ICD-10-CM | POA: Diagnosis not present

## 2019-03-31 DIAGNOSIS — I10 Essential (primary) hypertension: Secondary | ICD-10-CM | POA: Diagnosis not present

## 2020-05-11 DIAGNOSIS — M25512 Pain in left shoulder: Secondary | ICD-10-CM | POA: Diagnosis not present

## 2020-05-11 DIAGNOSIS — E119 Type 2 diabetes mellitus without complications: Secondary | ICD-10-CM | POA: Diagnosis not present

## 2020-05-11 DIAGNOSIS — D696 Thrombocytopenia, unspecified: Secondary | ICD-10-CM | POA: Diagnosis not present

## 2020-05-11 DIAGNOSIS — I10 Essential (primary) hypertension: Secondary | ICD-10-CM | POA: Diagnosis not present

## 2020-05-11 DIAGNOSIS — C220 Liver cell carcinoma: Secondary | ICD-10-CM | POA: Diagnosis not present

## 2020-05-11 DIAGNOSIS — R5383 Other fatigue: Secondary | ICD-10-CM | POA: Diagnosis not present

## 2020-05-22 DIAGNOSIS — R180 Malignant ascites: Secondary | ICD-10-CM | POA: Diagnosis not present

## 2020-05-22 DIAGNOSIS — C220 Liver cell carcinoma: Secondary | ICD-10-CM | POA: Diagnosis not present

## 2020-05-22 DIAGNOSIS — R188 Other ascites: Secondary | ICD-10-CM | POA: Diagnosis not present

## 2020-05-31 DIAGNOSIS — M6208 Separation of muscle (nontraumatic), other site: Secondary | ICD-10-CM | POA: Diagnosis not present

## 2020-05-31 DIAGNOSIS — K432 Incisional hernia without obstruction or gangrene: Secondary | ICD-10-CM | POA: Diagnosis not present

## 2020-06-19 DIAGNOSIS — K7460 Unspecified cirrhosis of liver: Secondary | ICD-10-CM | POA: Diagnosis not present

## 2020-06-19 DIAGNOSIS — K766 Portal hypertension: Secondary | ICD-10-CM | POA: Diagnosis not present

## 2020-06-19 DIAGNOSIS — C220 Liver cell carcinoma: Secondary | ICD-10-CM | POA: Diagnosis not present

## 2020-06-19 DIAGNOSIS — J8410 Pulmonary fibrosis, unspecified: Secondary | ICD-10-CM | POA: Diagnosis not present

## 2020-06-19 DIAGNOSIS — D6959 Other secondary thrombocytopenia: Secondary | ICD-10-CM | POA: Diagnosis not present

## 2020-06-19 DIAGNOSIS — R140 Abdominal distension (gaseous): Secondary | ICD-10-CM | POA: Diagnosis not present

## 2020-06-19 DIAGNOSIS — R932 Abnormal findings on diagnostic imaging of liver and biliary tract: Secondary | ICD-10-CM | POA: Diagnosis not present

## 2020-06-21 DIAGNOSIS — R188 Other ascites: Secondary | ICD-10-CM | POA: Diagnosis not present

## 2020-06-21 DIAGNOSIS — C220 Liver cell carcinoma: Secondary | ICD-10-CM | POA: Diagnosis not present

## 2020-07-04 DIAGNOSIS — Z01818 Encounter for other preprocedural examination: Secondary | ICD-10-CM | POA: Diagnosis not present

## 2020-07-04 DIAGNOSIS — K432 Incisional hernia without obstruction or gangrene: Secondary | ICD-10-CM | POA: Diagnosis not present

## 2020-07-08 DIAGNOSIS — J3489 Other specified disorders of nose and nasal sinuses: Secondary | ICD-10-CM | POA: Diagnosis not present

## 2020-07-09 DIAGNOSIS — J449 Chronic obstructive pulmonary disease, unspecified: Secondary | ICD-10-CM | POA: Diagnosis not present

## 2020-07-09 DIAGNOSIS — E039 Hypothyroidism, unspecified: Secondary | ICD-10-CM | POA: Diagnosis not present

## 2020-07-09 DIAGNOSIS — K219 Gastro-esophageal reflux disease without esophagitis: Secondary | ICD-10-CM | POA: Diagnosis not present

## 2020-07-09 DIAGNOSIS — D509 Iron deficiency anemia, unspecified: Secondary | ICD-10-CM | POA: Diagnosis not present

## 2020-07-09 DIAGNOSIS — K766 Portal hypertension: Secondary | ICD-10-CM | POA: Diagnosis not present

## 2020-07-09 DIAGNOSIS — C220 Liver cell carcinoma: Secondary | ICD-10-CM | POA: Diagnosis not present

## 2020-07-09 DIAGNOSIS — K432 Incisional hernia without obstruction or gangrene: Secondary | ICD-10-CM | POA: Diagnosis not present

## 2020-07-09 DIAGNOSIS — R188 Other ascites: Secondary | ICD-10-CM | POA: Diagnosis not present

## 2020-07-09 DIAGNOSIS — D696 Thrombocytopenia, unspecified: Secondary | ICD-10-CM | POA: Diagnosis not present

## 2020-07-09 DIAGNOSIS — Z8505 Personal history of malignant neoplasm of liver: Secondary | ICD-10-CM | POA: Diagnosis not present

## 2020-07-09 DIAGNOSIS — G4700 Insomnia, unspecified: Secondary | ICD-10-CM | POA: Diagnosis not present

## 2020-07-19 DIAGNOSIS — R188 Other ascites: Secondary | ICD-10-CM | POA: Diagnosis not present

## 2020-07-30 DIAGNOSIS — R188 Other ascites: Secondary | ICD-10-CM | POA: Diagnosis not present

## 2020-08-09 DIAGNOSIS — R188 Other ascites: Secondary | ICD-10-CM | POA: Diagnosis not present

## 2020-08-16 DIAGNOSIS — G252 Other specified forms of tremor: Secondary | ICD-10-CM | POA: Diagnosis not present

## 2020-08-16 DIAGNOSIS — Z8719 Personal history of other diseases of the digestive system: Secondary | ICD-10-CM | POA: Diagnosis not present

## 2020-08-16 DIAGNOSIS — H538 Other visual disturbances: Secondary | ICD-10-CM | POA: Diagnosis not present

## 2020-08-16 DIAGNOSIS — Z9889 Other specified postprocedural states: Secondary | ICD-10-CM | POA: Diagnosis not present

## 2020-08-20 DIAGNOSIS — C220 Liver cell carcinoma: Secondary | ICD-10-CM | POA: Diagnosis not present

## 2020-08-23 DIAGNOSIS — R7401 Elevation of levels of liver transaminase levels: Secondary | ICD-10-CM | POA: Diagnosis not present

## 2020-08-23 DIAGNOSIS — C220 Liver cell carcinoma: Secondary | ICD-10-CM | POA: Diagnosis not present

## 2020-08-23 DIAGNOSIS — R140 Abdominal distension (gaseous): Secondary | ICD-10-CM | POA: Diagnosis not present

## 2020-08-23 DIAGNOSIS — D696 Thrombocytopenia, unspecified: Secondary | ICD-10-CM | POA: Diagnosis not present

## 2020-08-23 DIAGNOSIS — Z79899 Other long term (current) drug therapy: Secondary | ICD-10-CM | POA: Diagnosis not present

## 2020-08-23 DIAGNOSIS — F17210 Nicotine dependence, cigarettes, uncomplicated: Secondary | ICD-10-CM | POA: Diagnosis not present

## 2020-08-23 DIAGNOSIS — E7220 Disorder of urea cycle metabolism, unspecified: Secondary | ICD-10-CM | POA: Diagnosis not present

## 2020-09-10 DIAGNOSIS — R8569 Abnormal cytological findings in specimens from other digestive organs and abdominal cavity: Secondary | ICD-10-CM | POA: Diagnosis not present

## 2020-09-25 DIAGNOSIS — R188 Other ascites: Secondary | ICD-10-CM | POA: Diagnosis not present

## 2020-09-25 DIAGNOSIS — C220 Liver cell carcinoma: Secondary | ICD-10-CM | POA: Diagnosis not present

## 2020-09-27 DIAGNOSIS — Z48815 Encounter for surgical aftercare following surgery on the digestive system: Secondary | ICD-10-CM | POA: Diagnosis not present

## 2020-09-27 DIAGNOSIS — C220 Liver cell carcinoma: Secondary | ICD-10-CM | POA: Diagnosis not present

## 2020-09-27 DIAGNOSIS — J8410 Pulmonary fibrosis, unspecified: Secondary | ICD-10-CM | POA: Diagnosis not present

## 2020-10-02 DIAGNOSIS — C220 Liver cell carcinoma: Secondary | ICD-10-CM | POA: Diagnosis not present

## 2020-10-02 DIAGNOSIS — F17210 Nicotine dependence, cigarettes, uncomplicated: Secondary | ICD-10-CM | POA: Diagnosis not present

## 2020-10-02 DIAGNOSIS — R140 Abdominal distension (gaseous): Secondary | ICD-10-CM | POA: Diagnosis not present

## 2020-10-02 DIAGNOSIS — D696 Thrombocytopenia, unspecified: Secondary | ICD-10-CM | POA: Diagnosis not present

## 2020-10-02 DIAGNOSIS — Z79899 Other long term (current) drug therapy: Secondary | ICD-10-CM | POA: Diagnosis not present

## 2020-10-08 DIAGNOSIS — C220 Liver cell carcinoma: Secondary | ICD-10-CM | POA: Diagnosis not present

## 2020-10-08 DIAGNOSIS — K7689 Other specified diseases of liver: Secondary | ICD-10-CM | POA: Diagnosis not present

## 2020-10-08 DIAGNOSIS — R918 Other nonspecific abnormal finding of lung field: Secondary | ICD-10-CM | POA: Diagnosis not present

## 2020-10-16 DIAGNOSIS — F17210 Nicotine dependence, cigarettes, uncomplicated: Secondary | ICD-10-CM | POA: Diagnosis not present

## 2020-10-16 DIAGNOSIS — I8510 Secondary esophageal varices without bleeding: Secondary | ICD-10-CM | POA: Diagnosis not present

## 2020-10-16 DIAGNOSIS — N182 Chronic kidney disease, stage 2 (mild): Secondary | ICD-10-CM | POA: Diagnosis not present

## 2020-10-16 DIAGNOSIS — D696 Thrombocytopenia, unspecified: Secondary | ICD-10-CM | POA: Diagnosis not present

## 2020-10-16 DIAGNOSIS — Z8719 Personal history of other diseases of the digestive system: Secondary | ICD-10-CM | POA: Diagnosis not present

## 2020-10-16 DIAGNOSIS — K7290 Hepatic failure, unspecified without coma: Secondary | ICD-10-CM | POA: Diagnosis not present

## 2020-10-16 DIAGNOSIS — Z8619 Personal history of other infectious and parasitic diseases: Secondary | ICD-10-CM | POA: Diagnosis not present

## 2020-10-16 DIAGNOSIS — Z7409 Other reduced mobility: Secondary | ICD-10-CM | POA: Diagnosis not present

## 2020-10-16 DIAGNOSIS — E8809 Other disorders of plasma-protein metabolism, not elsewhere classified: Secondary | ICD-10-CM | POA: Diagnosis not present

## 2020-10-16 DIAGNOSIS — Z743 Need for continuous supervision: Secondary | ICD-10-CM | POA: Diagnosis not present

## 2020-10-16 DIAGNOSIS — G9341 Metabolic encephalopathy: Secondary | ICD-10-CM | POA: Diagnosis not present

## 2020-10-16 DIAGNOSIS — Z8505 Personal history of malignant neoplasm of liver: Secondary | ICD-10-CM | POA: Diagnosis not present

## 2020-10-16 DIAGNOSIS — R4020 Unspecified coma: Secondary | ICD-10-CM | POA: Diagnosis not present

## 2020-10-16 DIAGNOSIS — E1122 Type 2 diabetes mellitus with diabetic chronic kidney disease: Secondary | ICD-10-CM | POA: Diagnosis not present

## 2020-10-16 DIAGNOSIS — R2689 Other abnormalities of gait and mobility: Secondary | ICD-10-CM | POA: Diagnosis not present

## 2020-10-16 DIAGNOSIS — R195 Other fecal abnormalities: Secondary | ICD-10-CM | POA: Diagnosis not present

## 2020-10-16 DIAGNOSIS — D61818 Other pancytopenia: Secondary | ICD-10-CM | POA: Diagnosis not present

## 2020-10-16 DIAGNOSIS — E039 Hypothyroidism, unspecified: Secondary | ICD-10-CM | POA: Diagnosis not present

## 2020-10-16 DIAGNOSIS — D6959 Other secondary thrombocytopenia: Secondary | ICD-10-CM | POA: Diagnosis not present

## 2020-10-16 DIAGNOSIS — I129 Hypertensive chronic kidney disease with stage 1 through stage 4 chronic kidney disease, or unspecified chronic kidney disease: Secondary | ICD-10-CM | POA: Diagnosis not present

## 2020-10-16 DIAGNOSIS — Z79899 Other long term (current) drug therapy: Secondary | ICD-10-CM | POA: Diagnosis not present

## 2020-10-16 DIAGNOSIS — J320 Chronic maxillary sinusitis: Secondary | ICD-10-CM | POA: Diagnosis not present

## 2020-10-16 DIAGNOSIS — Z9114 Patient's other noncompliance with medication regimen: Secondary | ICD-10-CM | POA: Diagnosis not present

## 2020-10-16 DIAGNOSIS — R404 Transient alteration of awareness: Secondary | ICD-10-CM | POA: Diagnosis not present

## 2020-10-17 DIAGNOSIS — K7290 Hepatic failure, unspecified without coma: Secondary | ICD-10-CM | POA: Diagnosis not present

## 2020-10-17 DIAGNOSIS — R9431 Abnormal electrocardiogram [ECG] [EKG]: Secondary | ICD-10-CM | POA: Diagnosis not present

## 2020-10-17 DIAGNOSIS — E039 Hypothyroidism, unspecified: Secondary | ICD-10-CM | POA: Diagnosis not present

## 2020-10-18 DIAGNOSIS — K7460 Unspecified cirrhosis of liver: Secondary | ICD-10-CM | POA: Diagnosis not present

## 2020-10-18 DIAGNOSIS — K7200 Acute and subacute hepatic failure without coma: Secondary | ICD-10-CM | POA: Diagnosis not present

## 2020-10-18 DIAGNOSIS — N182 Chronic kidney disease, stage 2 (mild): Secondary | ICD-10-CM | POA: Diagnosis not present

## 2020-10-18 DIAGNOSIS — R188 Other ascites: Secondary | ICD-10-CM | POA: Diagnosis not present

## 2020-10-18 DIAGNOSIS — I129 Hypertensive chronic kidney disease with stage 1 through stage 4 chronic kidney disease, or unspecified chronic kidney disease: Secondary | ICD-10-CM | POA: Diagnosis not present

## 2020-10-30 DIAGNOSIS — R188 Other ascites: Secondary | ICD-10-CM | POA: Diagnosis not present

## 2020-10-30 DIAGNOSIS — C220 Liver cell carcinoma: Secondary | ICD-10-CM | POA: Diagnosis not present

## 2020-11-05 DIAGNOSIS — H524 Presbyopia: Secondary | ICD-10-CM | POA: Diagnosis not present

## 2020-11-05 DIAGNOSIS — H35033 Hypertensive retinopathy, bilateral: Secondary | ICD-10-CM | POA: Diagnosis not present

## 2020-11-05 DIAGNOSIS — H5203 Hypermetropia, bilateral: Secondary | ICD-10-CM | POA: Diagnosis not present

## 2020-11-05 DIAGNOSIS — E119 Type 2 diabetes mellitus without complications: Secondary | ICD-10-CM | POA: Diagnosis not present

## 2020-11-28 DIAGNOSIS — C220 Liver cell carcinoma: Secondary | ICD-10-CM | POA: Diagnosis not present

## 2020-11-28 DIAGNOSIS — R188 Other ascites: Secondary | ICD-10-CM | POA: Diagnosis not present

## 2020-12-03 DIAGNOSIS — R1030 Lower abdominal pain, unspecified: Secondary | ICD-10-CM | POA: Diagnosis not present

## 2020-12-03 DIAGNOSIS — R188 Other ascites: Secondary | ICD-10-CM | POA: Diagnosis not present

## 2020-12-06 DIAGNOSIS — K7460 Unspecified cirrhosis of liver: Secondary | ICD-10-CM | POA: Diagnosis not present

## 2020-12-06 DIAGNOSIS — I700 Atherosclerosis of aorta: Secondary | ICD-10-CM | POA: Diagnosis not present

## 2020-12-06 DIAGNOSIS — C220 Liver cell carcinoma: Secondary | ICD-10-CM | POA: Diagnosis not present

## 2020-12-06 DIAGNOSIS — K766 Portal hypertension: Secondary | ICD-10-CM | POA: Diagnosis not present

## 2020-12-06 DIAGNOSIS — Z8619 Personal history of other infectious and parasitic diseases: Secondary | ICD-10-CM | POA: Diagnosis not present

## 2020-12-06 DIAGNOSIS — R109 Unspecified abdominal pain: Secondary | ICD-10-CM | POA: Diagnosis not present

## 2020-12-06 DIAGNOSIS — R161 Splenomegaly, not elsewhere classified: Secondary | ICD-10-CM | POA: Diagnosis not present

## 2020-12-06 DIAGNOSIS — R140 Abdominal distension (gaseous): Secondary | ICD-10-CM | POA: Diagnosis not present

## 2020-12-06 DIAGNOSIS — R600 Localized edema: Secondary | ICD-10-CM | POA: Diagnosis not present

## 2020-12-06 DIAGNOSIS — R188 Other ascites: Secondary | ICD-10-CM | POA: Diagnosis not present

## 2020-12-06 DIAGNOSIS — K6389 Other specified diseases of intestine: Secondary | ICD-10-CM | POA: Diagnosis not present

## 2020-12-06 DIAGNOSIS — R10819 Abdominal tenderness, unspecified site: Secondary | ICD-10-CM | POA: Diagnosis not present

## 2020-12-06 DIAGNOSIS — R112 Nausea with vomiting, unspecified: Secondary | ICD-10-CM | POA: Diagnosis not present

## 2020-12-12 DIAGNOSIS — Z9114 Patient's other noncompliance with medication regimen: Secondary | ICD-10-CM | POA: Diagnosis not present

## 2020-12-12 DIAGNOSIS — K7290 Hepatic failure, unspecified without coma: Secondary | ICD-10-CM | POA: Diagnosis not present

## 2020-12-12 DIAGNOSIS — E8770 Fluid overload, unspecified: Secondary | ICD-10-CM | POA: Diagnosis not present

## 2020-12-19 DIAGNOSIS — R188 Other ascites: Secondary | ICD-10-CM | POA: Diagnosis not present

## 2020-12-19 DIAGNOSIS — R180 Malignant ascites: Secondary | ICD-10-CM | POA: Diagnosis not present

## 2020-12-19 DIAGNOSIS — C220 Liver cell carcinoma: Secondary | ICD-10-CM | POA: Diagnosis not present

## 2020-12-31 DIAGNOSIS — R188 Other ascites: Secondary | ICD-10-CM | POA: Diagnosis not present

## 2021-01-07 DIAGNOSIS — R188 Other ascites: Secondary | ICD-10-CM | POA: Diagnosis not present

## 2021-01-15 DIAGNOSIS — K7460 Unspecified cirrhosis of liver: Secondary | ICD-10-CM | POA: Diagnosis not present

## 2021-01-15 DIAGNOSIS — R188 Other ascites: Secondary | ICD-10-CM | POA: Diagnosis not present

## 2021-01-22 DIAGNOSIS — R188 Other ascites: Secondary | ICD-10-CM | POA: Diagnosis not present

## 2021-01-29 DIAGNOSIS — R188 Other ascites: Secondary | ICD-10-CM | POA: Diagnosis not present

## 2021-02-05 DIAGNOSIS — R188 Other ascites: Secondary | ICD-10-CM | POA: Diagnosis not present

## 2021-02-05 DIAGNOSIS — K7460 Unspecified cirrhosis of liver: Secondary | ICD-10-CM | POA: Diagnosis not present

## 2021-02-12 DIAGNOSIS — R188 Other ascites: Secondary | ICD-10-CM | POA: Diagnosis not present

## 2021-02-12 DIAGNOSIS — K7460 Unspecified cirrhosis of liver: Secondary | ICD-10-CM | POA: Diagnosis not present

## 2021-02-19 DIAGNOSIS — R188 Other ascites: Secondary | ICD-10-CM | POA: Diagnosis not present

## 2021-03-08 DIAGNOSIS — K7460 Unspecified cirrhosis of liver: Secondary | ICD-10-CM | POA: Diagnosis not present

## 2021-03-08 DIAGNOSIS — R188 Other ascites: Secondary | ICD-10-CM | POA: Diagnosis not present

## 2021-03-13 DIAGNOSIS — R188 Other ascites: Secondary | ICD-10-CM | POA: Diagnosis not present

## 2021-03-14 DIAGNOSIS — K7469 Other cirrhosis of liver: Secondary | ICD-10-CM | POA: Diagnosis not present

## 2021-03-14 DIAGNOSIS — K5900 Constipation, unspecified: Secondary | ICD-10-CM | POA: Diagnosis not present

## 2021-03-14 DIAGNOSIS — R188 Other ascites: Secondary | ICD-10-CM | POA: Diagnosis not present

## 2021-03-14 DIAGNOSIS — R410 Disorientation, unspecified: Secondary | ICD-10-CM | POA: Diagnosis not present

## 2021-03-14 DIAGNOSIS — R42 Dizziness and giddiness: Secondary | ICD-10-CM | POA: Diagnosis not present

## 2021-03-14 DIAGNOSIS — C220 Liver cell carcinoma: Secondary | ICD-10-CM | POA: Diagnosis not present

## 2021-03-14 DIAGNOSIS — R140 Abdominal distension (gaseous): Secondary | ICD-10-CM | POA: Diagnosis not present

## 2021-03-18 DIAGNOSIS — R188 Other ascites: Secondary | ICD-10-CM | POA: Diagnosis not present

## 2021-03-20 DIAGNOSIS — C220 Liver cell carcinoma: Secondary | ICD-10-CM | POA: Diagnosis not present

## 2021-03-20 DIAGNOSIS — Z79899 Other long term (current) drug therapy: Secondary | ICD-10-CM | POA: Diagnosis not present

## 2021-03-20 DIAGNOSIS — K7290 Hepatic failure, unspecified without coma: Secondary | ICD-10-CM | POA: Diagnosis not present

## 2021-03-20 DIAGNOSIS — Z792 Long term (current) use of antibiotics: Secondary | ICD-10-CM | POA: Diagnosis not present

## 2021-03-22 DIAGNOSIS — R188 Other ascites: Secondary | ICD-10-CM | POA: Diagnosis not present

## 2021-03-22 DIAGNOSIS — K7460 Unspecified cirrhosis of liver: Secondary | ICD-10-CM | POA: Diagnosis not present

## 2021-03-29 DIAGNOSIS — K7460 Unspecified cirrhosis of liver: Secondary | ICD-10-CM | POA: Diagnosis not present

## 2021-03-29 DIAGNOSIS — R188 Other ascites: Secondary | ICD-10-CM | POA: Diagnosis not present

## 2021-04-03 DIAGNOSIS — I129 Hypertensive chronic kidney disease with stage 1 through stage 4 chronic kidney disease, or unspecified chronic kidney disease: Secondary | ICD-10-CM | POA: Diagnosis not present

## 2021-04-03 DIAGNOSIS — I629 Nontraumatic intracranial hemorrhage, unspecified: Secondary | ICD-10-CM | POA: Diagnosis not present

## 2021-04-03 DIAGNOSIS — J449 Chronic obstructive pulmonary disease, unspecified: Secondary | ICD-10-CM | POA: Diagnosis not present

## 2021-04-03 DIAGNOSIS — R509 Fever, unspecified: Secondary | ICD-10-CM | POA: Diagnosis not present

## 2021-04-03 DIAGNOSIS — R000 Tachycardia, unspecified: Secondary | ICD-10-CM | POA: Diagnosis not present

## 2021-04-03 DIAGNOSIS — I10 Essential (primary) hypertension: Secondary | ICD-10-CM | POA: Diagnosis not present

## 2021-04-03 DIAGNOSIS — E8770 Fluid overload, unspecified: Secondary | ICD-10-CM | POA: Diagnosis not present

## 2021-04-03 DIAGNOSIS — Z8505 Personal history of malignant neoplasm of liver: Secondary | ICD-10-CM | POA: Diagnosis not present

## 2021-04-03 DIAGNOSIS — E1165 Type 2 diabetes mellitus with hyperglycemia: Secondary | ICD-10-CM | POA: Diagnosis not present

## 2021-04-03 DIAGNOSIS — F17210 Nicotine dependence, cigarettes, uncomplicated: Secondary | ICD-10-CM | POA: Diagnosis not present

## 2021-04-03 DIAGNOSIS — D6959 Other secondary thrombocytopenia: Secondary | ICD-10-CM | POA: Diagnosis not present

## 2021-04-03 DIAGNOSIS — I615 Nontraumatic intracerebral hemorrhage, intraventricular: Secondary | ICD-10-CM | POA: Diagnosis not present

## 2021-04-03 DIAGNOSIS — N182 Chronic kidney disease, stage 2 (mild): Secondary | ICD-10-CM | POA: Diagnosis not present

## 2021-04-03 DIAGNOSIS — D72829 Elevated white blood cell count, unspecified: Secondary | ICD-10-CM | POA: Diagnosis not present

## 2021-04-03 DIAGNOSIS — I8510 Secondary esophageal varices without bleeding: Secondary | ICD-10-CM | POA: Diagnosis not present

## 2021-04-03 DIAGNOSIS — D696 Thrombocytopenia, unspecified: Secondary | ICD-10-CM | POA: Diagnosis not present

## 2021-04-03 DIAGNOSIS — Z7289 Other problems related to lifestyle: Secondary | ICD-10-CM | POA: Diagnosis not present

## 2021-04-03 DIAGNOSIS — Z79899 Other long term (current) drug therapy: Secondary | ICD-10-CM | POA: Diagnosis not present

## 2021-04-03 DIAGNOSIS — G9340 Encephalopathy, unspecified: Secondary | ICD-10-CM | POA: Diagnosis not present

## 2021-04-03 DIAGNOSIS — F32A Depression, unspecified: Secondary | ICD-10-CM | POA: Diagnosis not present

## 2021-04-03 DIAGNOSIS — E1122 Type 2 diabetes mellitus with diabetic chronic kidney disease: Secondary | ICD-10-CM | POA: Diagnosis not present

## 2021-04-03 DIAGNOSIS — E039 Hypothyroidism, unspecified: Secondary | ICD-10-CM | POA: Diagnosis not present

## 2021-04-03 DIAGNOSIS — R001 Bradycardia, unspecified: Secondary | ICD-10-CM | POA: Diagnosis not present

## 2021-04-03 DIAGNOSIS — E871 Hypo-osmolality and hyponatremia: Secondary | ICD-10-CM | POA: Diagnosis not present

## 2021-04-03 DIAGNOSIS — Z20822 Contact with and (suspected) exposure to covid-19: Secondary | ICD-10-CM | POA: Diagnosis not present

## 2021-04-03 DIAGNOSIS — G919 Hydrocephalus, unspecified: Secondary | ICD-10-CM | POA: Diagnosis not present

## 2021-04-03 DIAGNOSIS — G9341 Metabolic encephalopathy: Secondary | ICD-10-CM | POA: Diagnosis not present

## 2021-04-03 DIAGNOSIS — I8500 Esophageal varices without bleeding: Secondary | ICD-10-CM | POA: Diagnosis not present

## 2021-04-03 DIAGNOSIS — I509 Heart failure, unspecified: Secondary | ICD-10-CM | POA: Diagnosis not present

## 2021-04-03 DIAGNOSIS — G9389 Other specified disorders of brain: Secondary | ICD-10-CM | POA: Diagnosis not present

## 2021-04-03 DIAGNOSIS — R4701 Aphasia: Secondary | ICD-10-CM | POA: Diagnosis not present

## 2021-04-03 DIAGNOSIS — G936 Cerebral edema: Secondary | ICD-10-CM | POA: Diagnosis not present

## 2021-04-03 DIAGNOSIS — G911 Obstructive hydrocephalus: Secondary | ICD-10-CM | POA: Diagnosis not present

## 2021-04-03 DIAGNOSIS — I959 Hypotension, unspecified: Secondary | ICD-10-CM | POA: Diagnosis not present

## 2021-04-04 DIAGNOSIS — N182 Chronic kidney disease, stage 2 (mild): Secondary | ICD-10-CM | POA: Diagnosis not present

## 2021-04-04 DIAGNOSIS — I509 Heart failure, unspecified: Secondary | ICD-10-CM | POA: Diagnosis not present

## 2021-04-04 DIAGNOSIS — G911 Obstructive hydrocephalus: Secondary | ICD-10-CM | POA: Diagnosis not present

## 2021-04-04 DIAGNOSIS — R001 Bradycardia, unspecified: Secondary | ICD-10-CM | POA: Diagnosis not present

## 2021-04-04 DIAGNOSIS — E1122 Type 2 diabetes mellitus with diabetic chronic kidney disease: Secondary | ICD-10-CM | POA: Diagnosis not present

## 2021-04-04 DIAGNOSIS — I615 Nontraumatic intracerebral hemorrhage, intraventricular: Secondary | ICD-10-CM | POA: Diagnosis not present

## 2021-04-04 DIAGNOSIS — D696 Thrombocytopenia, unspecified: Secondary | ICD-10-CM | POA: Diagnosis not present

## 2021-04-04 DIAGNOSIS — G9340 Encephalopathy, unspecified: Secondary | ICD-10-CM | POA: Diagnosis not present

## 2021-04-04 DIAGNOSIS — J449 Chronic obstructive pulmonary disease, unspecified: Secondary | ICD-10-CM | POA: Diagnosis not present

## 2021-04-04 DIAGNOSIS — E039 Hypothyroidism, unspecified: Secondary | ICD-10-CM | POA: Diagnosis not present

## 2021-04-05 DIAGNOSIS — G9340 Encephalopathy, unspecified: Secondary | ICD-10-CM | POA: Diagnosis not present

## 2021-04-05 DIAGNOSIS — N182 Chronic kidney disease, stage 2 (mild): Secondary | ICD-10-CM | POA: Diagnosis not present

## 2021-04-05 DIAGNOSIS — R001 Bradycardia, unspecified: Secondary | ICD-10-CM | POA: Diagnosis not present

## 2021-04-05 DIAGNOSIS — E1122 Type 2 diabetes mellitus with diabetic chronic kidney disease: Secondary | ICD-10-CM | POA: Diagnosis not present

## 2021-04-05 DIAGNOSIS — I615 Nontraumatic intracerebral hemorrhage, intraventricular: Secondary | ICD-10-CM | POA: Diagnosis not present

## 2021-04-05 DIAGNOSIS — G9389 Other specified disorders of brain: Secondary | ICD-10-CM | POA: Diagnosis not present

## 2021-04-05 DIAGNOSIS — D696 Thrombocytopenia, unspecified: Secondary | ICD-10-CM | POA: Diagnosis not present

## 2021-04-05 DIAGNOSIS — G911 Obstructive hydrocephalus: Secondary | ICD-10-CM | POA: Diagnosis not present

## 2021-04-05 DIAGNOSIS — J449 Chronic obstructive pulmonary disease, unspecified: Secondary | ICD-10-CM | POA: Diagnosis not present

## 2021-04-05 DIAGNOSIS — I8510 Secondary esophageal varices without bleeding: Secondary | ICD-10-CM | POA: Diagnosis not present

## 2021-04-06 DIAGNOSIS — G911 Obstructive hydrocephalus: Secondary | ICD-10-CM | POA: Diagnosis not present

## 2021-04-06 DIAGNOSIS — I8510 Secondary esophageal varices without bleeding: Secondary | ICD-10-CM | POA: Diagnosis not present

## 2021-04-06 DIAGNOSIS — R001 Bradycardia, unspecified: Secondary | ICD-10-CM | POA: Diagnosis not present

## 2021-04-06 DIAGNOSIS — I615 Nontraumatic intracerebral hemorrhage, intraventricular: Secondary | ICD-10-CM | POA: Diagnosis not present

## 2021-04-06 DIAGNOSIS — G9340 Encephalopathy, unspecified: Secondary | ICD-10-CM | POA: Diagnosis not present

## 2021-04-06 DIAGNOSIS — J449 Chronic obstructive pulmonary disease, unspecified: Secondary | ICD-10-CM | POA: Diagnosis not present

## 2021-04-06 DIAGNOSIS — G919 Hydrocephalus, unspecified: Secondary | ICD-10-CM | POA: Diagnosis not present

## 2021-04-06 DIAGNOSIS — D696 Thrombocytopenia, unspecified: Secondary | ICD-10-CM | POA: Diagnosis not present

## 2021-04-06 DIAGNOSIS — N182 Chronic kidney disease, stage 2 (mild): Secondary | ICD-10-CM | POA: Diagnosis not present

## 2021-04-06 DIAGNOSIS — E1122 Type 2 diabetes mellitus with diabetic chronic kidney disease: Secondary | ICD-10-CM | POA: Diagnosis not present

## 2021-04-07 DIAGNOSIS — G919 Hydrocephalus, unspecified: Secondary | ICD-10-CM | POA: Diagnosis not present

## 2021-04-07 DIAGNOSIS — J449 Chronic obstructive pulmonary disease, unspecified: Secondary | ICD-10-CM | POA: Diagnosis not present

## 2021-04-07 DIAGNOSIS — I8510 Secondary esophageal varices without bleeding: Secondary | ICD-10-CM | POA: Diagnosis not present

## 2021-04-07 DIAGNOSIS — D696 Thrombocytopenia, unspecified: Secondary | ICD-10-CM | POA: Diagnosis not present

## 2021-04-07 DIAGNOSIS — I615 Nontraumatic intracerebral hemorrhage, intraventricular: Secondary | ICD-10-CM | POA: Diagnosis not present

## 2021-04-07 DIAGNOSIS — G911 Obstructive hydrocephalus: Secondary | ICD-10-CM | POA: Diagnosis not present

## 2021-04-07 DIAGNOSIS — E1122 Type 2 diabetes mellitus with diabetic chronic kidney disease: Secondary | ICD-10-CM | POA: Diagnosis not present

## 2021-04-07 DIAGNOSIS — R001 Bradycardia, unspecified: Secondary | ICD-10-CM | POA: Diagnosis not present

## 2021-04-07 DIAGNOSIS — G9340 Encephalopathy, unspecified: Secondary | ICD-10-CM | POA: Diagnosis not present

## 2021-04-07 DIAGNOSIS — N182 Chronic kidney disease, stage 2 (mild): Secondary | ICD-10-CM | POA: Diagnosis not present

## 2021-04-08 DIAGNOSIS — F32A Depression, unspecified: Secondary | ICD-10-CM | POA: Diagnosis not present

## 2021-04-08 DIAGNOSIS — G919 Hydrocephalus, unspecified: Secondary | ICD-10-CM | POA: Diagnosis not present

## 2021-04-08 DIAGNOSIS — N182 Chronic kidney disease, stage 2 (mild): Secondary | ICD-10-CM | POA: Diagnosis not present

## 2021-04-08 DIAGNOSIS — R001 Bradycardia, unspecified: Secondary | ICD-10-CM | POA: Diagnosis not present

## 2021-04-08 DIAGNOSIS — E1122 Type 2 diabetes mellitus with diabetic chronic kidney disease: Secondary | ICD-10-CM | POA: Diagnosis not present

## 2021-04-08 DIAGNOSIS — E039 Hypothyroidism, unspecified: Secondary | ICD-10-CM | POA: Diagnosis not present

## 2021-04-08 DIAGNOSIS — I8500 Esophageal varices without bleeding: Secondary | ICD-10-CM | POA: Diagnosis not present

## 2021-04-08 DIAGNOSIS — J449 Chronic obstructive pulmonary disease, unspecified: Secondary | ICD-10-CM | POA: Diagnosis not present

## 2021-04-08 DIAGNOSIS — I615 Nontraumatic intracerebral hemorrhage, intraventricular: Secondary | ICD-10-CM | POA: Diagnosis not present

## 2021-04-09 DIAGNOSIS — E1122 Type 2 diabetes mellitus with diabetic chronic kidney disease: Secondary | ICD-10-CM | POA: Diagnosis not present

## 2021-04-09 DIAGNOSIS — E039 Hypothyroidism, unspecified: Secondary | ICD-10-CM | POA: Diagnosis not present

## 2021-04-09 DIAGNOSIS — N182 Chronic kidney disease, stage 2 (mild): Secondary | ICD-10-CM | POA: Diagnosis not present

## 2021-04-09 DIAGNOSIS — G9389 Other specified disorders of brain: Secondary | ICD-10-CM | POA: Diagnosis not present

## 2021-04-09 DIAGNOSIS — J449 Chronic obstructive pulmonary disease, unspecified: Secondary | ICD-10-CM | POA: Diagnosis not present

## 2021-04-09 DIAGNOSIS — G919 Hydrocephalus, unspecified: Secondary | ICD-10-CM | POA: Diagnosis not present

## 2021-04-09 DIAGNOSIS — R001 Bradycardia, unspecified: Secondary | ICD-10-CM | POA: Diagnosis not present

## 2021-04-09 DIAGNOSIS — D696 Thrombocytopenia, unspecified: Secondary | ICD-10-CM | POA: Diagnosis not present

## 2021-04-09 DIAGNOSIS — I615 Nontraumatic intracerebral hemorrhage, intraventricular: Secondary | ICD-10-CM | POA: Diagnosis not present

## 2021-04-09 DIAGNOSIS — I8510 Secondary esophageal varices without bleeding: Secondary | ICD-10-CM | POA: Diagnosis not present

## 2021-04-10 DIAGNOSIS — R509 Fever, unspecified: Secondary | ICD-10-CM | POA: Diagnosis not present

## 2021-04-10 DIAGNOSIS — E039 Hypothyroidism, unspecified: Secondary | ICD-10-CM | POA: Diagnosis not present

## 2021-04-10 DIAGNOSIS — I959 Hypotension, unspecified: Secondary | ICD-10-CM | POA: Diagnosis not present

## 2021-04-10 DIAGNOSIS — E1122 Type 2 diabetes mellitus with diabetic chronic kidney disease: Secondary | ICD-10-CM | POA: Diagnosis not present

## 2021-04-10 DIAGNOSIS — D696 Thrombocytopenia, unspecified: Secondary | ICD-10-CM | POA: Diagnosis not present

## 2021-04-10 DIAGNOSIS — I615 Nontraumatic intracerebral hemorrhage, intraventricular: Secondary | ICD-10-CM | POA: Diagnosis not present

## 2021-04-10 DIAGNOSIS — R001 Bradycardia, unspecified: Secondary | ICD-10-CM | POA: Diagnosis not present

## 2021-04-10 DIAGNOSIS — G919 Hydrocephalus, unspecified: Secondary | ICD-10-CM | POA: Diagnosis not present

## 2021-04-10 DIAGNOSIS — J449 Chronic obstructive pulmonary disease, unspecified: Secondary | ICD-10-CM | POA: Diagnosis not present

## 2021-04-10 DIAGNOSIS — N182 Chronic kidney disease, stage 2 (mild): Secondary | ICD-10-CM | POA: Diagnosis not present

## 2021-04-10 DIAGNOSIS — I8510 Secondary esophageal varices without bleeding: Secondary | ICD-10-CM | POA: Diagnosis not present

## 2021-04-11 DIAGNOSIS — I615 Nontraumatic intracerebral hemorrhage, intraventricular: Secondary | ICD-10-CM | POA: Diagnosis not present

## 2021-04-11 DIAGNOSIS — R001 Bradycardia, unspecified: Secondary | ICD-10-CM | POA: Diagnosis not present

## 2021-04-11 DIAGNOSIS — G919 Hydrocephalus, unspecified: Secondary | ICD-10-CM | POA: Diagnosis not present

## 2021-04-11 DIAGNOSIS — E871 Hypo-osmolality and hyponatremia: Secondary | ICD-10-CM | POA: Diagnosis not present

## 2021-04-11 DIAGNOSIS — R509 Fever, unspecified: Secondary | ICD-10-CM | POA: Diagnosis not present

## 2021-04-11 DIAGNOSIS — N182 Chronic kidney disease, stage 2 (mild): Secondary | ICD-10-CM | POA: Diagnosis not present

## 2021-04-11 DIAGNOSIS — I8510 Secondary esophageal varices without bleeding: Secondary | ICD-10-CM | POA: Diagnosis not present

## 2021-04-11 DIAGNOSIS — E039 Hypothyroidism, unspecified: Secondary | ICD-10-CM | POA: Diagnosis not present

## 2021-04-11 DIAGNOSIS — E1122 Type 2 diabetes mellitus with diabetic chronic kidney disease: Secondary | ICD-10-CM | POA: Diagnosis not present

## 2021-04-11 DIAGNOSIS — J449 Chronic obstructive pulmonary disease, unspecified: Secondary | ICD-10-CM | POA: Diagnosis not present

## 2021-04-12 DIAGNOSIS — E871 Hypo-osmolality and hyponatremia: Secondary | ICD-10-CM | POA: Diagnosis not present

## 2021-04-12 DIAGNOSIS — I8510 Secondary esophageal varices without bleeding: Secondary | ICD-10-CM | POA: Diagnosis not present

## 2021-04-12 DIAGNOSIS — G919 Hydrocephalus, unspecified: Secondary | ICD-10-CM | POA: Diagnosis not present

## 2021-04-12 DIAGNOSIS — J449 Chronic obstructive pulmonary disease, unspecified: Secondary | ICD-10-CM | POA: Diagnosis not present

## 2021-04-12 DIAGNOSIS — E1122 Type 2 diabetes mellitus with diabetic chronic kidney disease: Secondary | ICD-10-CM | POA: Diagnosis not present

## 2021-04-12 DIAGNOSIS — R001 Bradycardia, unspecified: Secondary | ICD-10-CM | POA: Diagnosis not present

## 2021-04-12 DIAGNOSIS — N182 Chronic kidney disease, stage 2 (mild): Secondary | ICD-10-CM | POA: Diagnosis not present

## 2021-04-12 DIAGNOSIS — D696 Thrombocytopenia, unspecified: Secondary | ICD-10-CM | POA: Diagnosis not present

## 2021-04-12 DIAGNOSIS — I615 Nontraumatic intracerebral hemorrhage, intraventricular: Secondary | ICD-10-CM | POA: Diagnosis not present

## 2021-04-12 DIAGNOSIS — E039 Hypothyroidism, unspecified: Secondary | ICD-10-CM | POA: Diagnosis not present

## 2021-04-13 DIAGNOSIS — I615 Nontraumatic intracerebral hemorrhage, intraventricular: Secondary | ICD-10-CM | POA: Diagnosis not present

## 2021-04-13 DIAGNOSIS — G919 Hydrocephalus, unspecified: Secondary | ICD-10-CM | POA: Diagnosis not present

## 2021-04-17 DIAGNOSIS — R188 Other ascites: Secondary | ICD-10-CM | POA: Diagnosis not present

## 2021-04-23 DIAGNOSIS — E039 Hypothyroidism, unspecified: Secondary | ICD-10-CM | POA: Diagnosis not present

## 2021-04-23 DIAGNOSIS — I10 Essential (primary) hypertension: Secondary | ICD-10-CM | POA: Diagnosis not present

## 2021-04-23 DIAGNOSIS — R188 Other ascites: Secondary | ICD-10-CM | POA: Diagnosis not present

## 2021-04-23 DIAGNOSIS — D72819 Decreased white blood cell count, unspecified: Secondary | ICD-10-CM | POA: Diagnosis not present

## 2021-04-23 DIAGNOSIS — R799 Abnormal finding of blood chemistry, unspecified: Secondary | ICD-10-CM | POA: Diagnosis not present

## 2021-04-29 DIAGNOSIS — R188 Other ascites: Secondary | ICD-10-CM | POA: Diagnosis not present

## 2021-04-29 DIAGNOSIS — K7460 Unspecified cirrhosis of liver: Secondary | ICD-10-CM | POA: Diagnosis not present

## 2021-04-30 DIAGNOSIS — Z4889 Encounter for other specified surgical aftercare: Secondary | ICD-10-CM | POA: Diagnosis not present

## 2021-05-01 DIAGNOSIS — D696 Thrombocytopenia, unspecified: Secondary | ICD-10-CM | POA: Diagnosis not present

## 2021-05-01 DIAGNOSIS — E119 Type 2 diabetes mellitus without complications: Secondary | ICD-10-CM | POA: Diagnosis not present

## 2021-05-01 DIAGNOSIS — G4700 Insomnia, unspecified: Secondary | ICD-10-CM | POA: Diagnosis not present

## 2021-05-01 DIAGNOSIS — E039 Hypothyroidism, unspecified: Secondary | ICD-10-CM | POA: Diagnosis not present

## 2021-05-01 DIAGNOSIS — I10 Essential (primary) hypertension: Secondary | ICD-10-CM | POA: Diagnosis not present

## 2021-05-01 DIAGNOSIS — Z9181 History of falling: Secondary | ICD-10-CM | POA: Diagnosis not present

## 2021-05-01 DIAGNOSIS — Z8505 Personal history of malignant neoplasm of liver: Secondary | ICD-10-CM | POA: Diagnosis not present

## 2021-05-01 DIAGNOSIS — J449 Chronic obstructive pulmonary disease, unspecified: Secondary | ICD-10-CM | POA: Diagnosis not present

## 2021-05-01 DIAGNOSIS — D72819 Decreased white blood cell count, unspecified: Secondary | ICD-10-CM | POA: Diagnosis not present

## 2021-05-03 DIAGNOSIS — D696 Thrombocytopenia, unspecified: Secondary | ICD-10-CM | POA: Diagnosis not present

## 2021-05-03 DIAGNOSIS — J449 Chronic obstructive pulmonary disease, unspecified: Secondary | ICD-10-CM | POA: Diagnosis not present

## 2021-05-03 DIAGNOSIS — I10 Essential (primary) hypertension: Secondary | ICD-10-CM | POA: Diagnosis not present

## 2021-05-03 DIAGNOSIS — E119 Type 2 diabetes mellitus without complications: Secondary | ICD-10-CM | POA: Diagnosis not present

## 2021-05-03 DIAGNOSIS — Z9181 History of falling: Secondary | ICD-10-CM | POA: Diagnosis not present

## 2021-05-03 DIAGNOSIS — E039 Hypothyroidism, unspecified: Secondary | ICD-10-CM | POA: Diagnosis not present

## 2021-05-03 DIAGNOSIS — G4700 Insomnia, unspecified: Secondary | ICD-10-CM | POA: Diagnosis not present

## 2021-05-03 DIAGNOSIS — D72819 Decreased white blood cell count, unspecified: Secondary | ICD-10-CM | POA: Diagnosis not present

## 2021-05-03 DIAGNOSIS — Z8505 Personal history of malignant neoplasm of liver: Secondary | ICD-10-CM | POA: Diagnosis not present

## 2021-05-06 DIAGNOSIS — E039 Hypothyroidism, unspecified: Secondary | ICD-10-CM | POA: Diagnosis not present

## 2021-05-06 DIAGNOSIS — J449 Chronic obstructive pulmonary disease, unspecified: Secondary | ICD-10-CM | POA: Diagnosis not present

## 2021-05-06 DIAGNOSIS — I10 Essential (primary) hypertension: Secondary | ICD-10-CM | POA: Diagnosis not present

## 2021-05-06 DIAGNOSIS — R188 Other ascites: Secondary | ICD-10-CM | POA: Diagnosis not present

## 2021-05-06 DIAGNOSIS — G4700 Insomnia, unspecified: Secondary | ICD-10-CM | POA: Diagnosis not present

## 2021-05-06 DIAGNOSIS — E119 Type 2 diabetes mellitus without complications: Secondary | ICD-10-CM | POA: Diagnosis not present

## 2021-05-06 DIAGNOSIS — D72819 Decreased white blood cell count, unspecified: Secondary | ICD-10-CM | POA: Diagnosis not present

## 2021-05-06 DIAGNOSIS — Z9181 History of falling: Secondary | ICD-10-CM | POA: Diagnosis not present

## 2021-05-06 DIAGNOSIS — Z8505 Personal history of malignant neoplasm of liver: Secondary | ICD-10-CM | POA: Diagnosis not present

## 2021-05-06 DIAGNOSIS — D696 Thrombocytopenia, unspecified: Secondary | ICD-10-CM | POA: Diagnosis not present

## 2021-05-20 DIAGNOSIS — R188 Other ascites: Secondary | ICD-10-CM | POA: Diagnosis not present

## 2021-05-20 DIAGNOSIS — K7460 Unspecified cirrhosis of liver: Secondary | ICD-10-CM | POA: Diagnosis not present

## 2021-05-21 DIAGNOSIS — D72819 Decreased white blood cell count, unspecified: Secondary | ICD-10-CM | POA: Diagnosis not present

## 2021-05-21 DIAGNOSIS — J449 Chronic obstructive pulmonary disease, unspecified: Secondary | ICD-10-CM | POA: Diagnosis not present

## 2021-05-21 DIAGNOSIS — G4700 Insomnia, unspecified: Secondary | ICD-10-CM | POA: Diagnosis not present

## 2021-05-21 DIAGNOSIS — I10 Essential (primary) hypertension: Secondary | ICD-10-CM | POA: Diagnosis not present

## 2021-05-21 DIAGNOSIS — Z8505 Personal history of malignant neoplasm of liver: Secondary | ICD-10-CM | POA: Diagnosis not present

## 2021-05-21 DIAGNOSIS — E039 Hypothyroidism, unspecified: Secondary | ICD-10-CM | POA: Diagnosis not present

## 2021-05-21 DIAGNOSIS — Z9181 History of falling: Secondary | ICD-10-CM | POA: Diagnosis not present

## 2021-05-21 DIAGNOSIS — D696 Thrombocytopenia, unspecified: Secondary | ICD-10-CM | POA: Diagnosis not present

## 2021-05-21 DIAGNOSIS — E119 Type 2 diabetes mellitus without complications: Secondary | ICD-10-CM | POA: Diagnosis not present

## 2021-05-22 DIAGNOSIS — D72819 Decreased white blood cell count, unspecified: Secondary | ICD-10-CM | POA: Diagnosis not present

## 2021-05-22 DIAGNOSIS — G4700 Insomnia, unspecified: Secondary | ICD-10-CM | POA: Diagnosis not present

## 2021-05-22 DIAGNOSIS — Z8505 Personal history of malignant neoplasm of liver: Secondary | ICD-10-CM | POA: Diagnosis not present

## 2021-05-22 DIAGNOSIS — J449 Chronic obstructive pulmonary disease, unspecified: Secondary | ICD-10-CM | POA: Diagnosis not present

## 2021-05-22 DIAGNOSIS — E039 Hypothyroidism, unspecified: Secondary | ICD-10-CM | POA: Diagnosis not present

## 2021-05-22 DIAGNOSIS — Z9181 History of falling: Secondary | ICD-10-CM | POA: Diagnosis not present

## 2021-05-22 DIAGNOSIS — E119 Type 2 diabetes mellitus without complications: Secondary | ICD-10-CM | POA: Diagnosis not present

## 2021-05-22 DIAGNOSIS — I10 Essential (primary) hypertension: Secondary | ICD-10-CM | POA: Diagnosis not present

## 2021-05-22 DIAGNOSIS — D696 Thrombocytopenia, unspecified: Secondary | ICD-10-CM | POA: Diagnosis not present

## 2021-05-23 DIAGNOSIS — E119 Type 2 diabetes mellitus without complications: Secondary | ICD-10-CM | POA: Diagnosis not present

## 2021-05-23 DIAGNOSIS — E039 Hypothyroidism, unspecified: Secondary | ICD-10-CM | POA: Diagnosis not present

## 2021-05-23 DIAGNOSIS — D72819 Decreased white blood cell count, unspecified: Secondary | ICD-10-CM | POA: Diagnosis not present

## 2021-05-23 DIAGNOSIS — I10 Essential (primary) hypertension: Secondary | ICD-10-CM | POA: Diagnosis not present

## 2021-05-23 DIAGNOSIS — R799 Abnormal finding of blood chemistry, unspecified: Secondary | ICD-10-CM | POA: Diagnosis not present

## 2021-05-23 DIAGNOSIS — R945 Abnormal results of liver function studies: Secondary | ICD-10-CM | POA: Diagnosis not present

## 2021-05-24 DIAGNOSIS — R188 Other ascites: Secondary | ICD-10-CM | POA: Diagnosis not present

## 2021-05-24 DIAGNOSIS — K7460 Unspecified cirrhosis of liver: Secondary | ICD-10-CM | POA: Diagnosis not present

## 2021-05-27 DIAGNOSIS — Z9181 History of falling: Secondary | ICD-10-CM | POA: Diagnosis not present

## 2021-05-27 DIAGNOSIS — I10 Essential (primary) hypertension: Secondary | ICD-10-CM | POA: Diagnosis not present

## 2021-05-27 DIAGNOSIS — J449 Chronic obstructive pulmonary disease, unspecified: Secondary | ICD-10-CM | POA: Diagnosis not present

## 2021-05-27 DIAGNOSIS — G4700 Insomnia, unspecified: Secondary | ICD-10-CM | POA: Diagnosis not present

## 2021-05-27 DIAGNOSIS — D696 Thrombocytopenia, unspecified: Secondary | ICD-10-CM | POA: Diagnosis not present

## 2021-05-27 DIAGNOSIS — Z8505 Personal history of malignant neoplasm of liver: Secondary | ICD-10-CM | POA: Diagnosis not present

## 2021-05-27 DIAGNOSIS — D72819 Decreased white blood cell count, unspecified: Secondary | ICD-10-CM | POA: Diagnosis not present

## 2021-05-27 DIAGNOSIS — E039 Hypothyroidism, unspecified: Secondary | ICD-10-CM | POA: Diagnosis not present

## 2021-05-27 DIAGNOSIS — E119 Type 2 diabetes mellitus without complications: Secondary | ICD-10-CM | POA: Diagnosis not present

## 2021-05-29 DIAGNOSIS — K7460 Unspecified cirrhosis of liver: Secondary | ICD-10-CM | POA: Diagnosis not present

## 2021-05-29 DIAGNOSIS — R188 Other ascites: Secondary | ICD-10-CM | POA: Diagnosis not present

## 2021-06-03 DIAGNOSIS — R188 Other ascites: Secondary | ICD-10-CM | POA: Diagnosis not present

## 2021-06-05 DIAGNOSIS — D72819 Decreased white blood cell count, unspecified: Secondary | ICD-10-CM | POA: Diagnosis not present

## 2021-06-05 DIAGNOSIS — G4700 Insomnia, unspecified: Secondary | ICD-10-CM | POA: Diagnosis not present

## 2021-06-05 DIAGNOSIS — E119 Type 2 diabetes mellitus without complications: Secondary | ICD-10-CM | POA: Diagnosis not present

## 2021-06-05 DIAGNOSIS — D696 Thrombocytopenia, unspecified: Secondary | ICD-10-CM | POA: Diagnosis not present

## 2021-06-05 DIAGNOSIS — Z9181 History of falling: Secondary | ICD-10-CM | POA: Diagnosis not present

## 2021-06-05 DIAGNOSIS — I10 Essential (primary) hypertension: Secondary | ICD-10-CM | POA: Diagnosis not present

## 2021-06-05 DIAGNOSIS — J449 Chronic obstructive pulmonary disease, unspecified: Secondary | ICD-10-CM | POA: Diagnosis not present

## 2021-06-05 DIAGNOSIS — Z8505 Personal history of malignant neoplasm of liver: Secondary | ICD-10-CM | POA: Diagnosis not present

## 2021-06-05 DIAGNOSIS — E039 Hypothyroidism, unspecified: Secondary | ICD-10-CM | POA: Diagnosis not present

## 2021-06-10 DIAGNOSIS — D72819 Decreased white blood cell count, unspecified: Secondary | ICD-10-CM | POA: Diagnosis not present

## 2021-06-10 DIAGNOSIS — I10 Essential (primary) hypertension: Secondary | ICD-10-CM | POA: Diagnosis not present

## 2021-06-10 DIAGNOSIS — D696 Thrombocytopenia, unspecified: Secondary | ICD-10-CM | POA: Diagnosis not present

## 2021-06-10 DIAGNOSIS — G4700 Insomnia, unspecified: Secondary | ICD-10-CM | POA: Diagnosis not present

## 2021-06-10 DIAGNOSIS — E119 Type 2 diabetes mellitus without complications: Secondary | ICD-10-CM | POA: Diagnosis not present

## 2021-06-10 DIAGNOSIS — E039 Hypothyroidism, unspecified: Secondary | ICD-10-CM | POA: Diagnosis not present

## 2021-06-10 DIAGNOSIS — J449 Chronic obstructive pulmonary disease, unspecified: Secondary | ICD-10-CM | POA: Diagnosis not present

## 2021-06-10 DIAGNOSIS — Z8505 Personal history of malignant neoplasm of liver: Secondary | ICD-10-CM | POA: Diagnosis not present

## 2021-06-10 DIAGNOSIS — Z9181 History of falling: Secondary | ICD-10-CM | POA: Diagnosis not present

## 2021-06-11 DIAGNOSIS — K766 Portal hypertension: Secondary | ICD-10-CM | POA: Diagnosis not present

## 2021-06-11 DIAGNOSIS — R188 Other ascites: Secondary | ICD-10-CM | POA: Diagnosis not present

## 2021-06-11 DIAGNOSIS — Z8505 Personal history of malignant neoplasm of liver: Secondary | ICD-10-CM | POA: Diagnosis not present

## 2021-06-11 DIAGNOSIS — C220 Liver cell carcinoma: Secondary | ICD-10-CM | POA: Diagnosis not present

## 2021-06-11 DIAGNOSIS — R140 Abdominal distension (gaseous): Secondary | ICD-10-CM | POA: Diagnosis not present

## 2021-06-11 DIAGNOSIS — K7460 Unspecified cirrhosis of liver: Secondary | ICD-10-CM | POA: Diagnosis not present

## 2021-06-14 DIAGNOSIS — I10 Essential (primary) hypertension: Secondary | ICD-10-CM | POA: Diagnosis not present

## 2021-06-14 DIAGNOSIS — E119 Type 2 diabetes mellitus without complications: Secondary | ICD-10-CM | POA: Diagnosis not present

## 2021-06-14 DIAGNOSIS — G4700 Insomnia, unspecified: Secondary | ICD-10-CM | POA: Diagnosis not present

## 2021-06-14 DIAGNOSIS — E039 Hypothyroidism, unspecified: Secondary | ICD-10-CM | POA: Diagnosis not present

## 2021-06-14 DIAGNOSIS — Z8505 Personal history of malignant neoplasm of liver: Secondary | ICD-10-CM | POA: Diagnosis not present

## 2021-06-14 DIAGNOSIS — D696 Thrombocytopenia, unspecified: Secondary | ICD-10-CM | POA: Diagnosis not present

## 2021-06-14 DIAGNOSIS — J449 Chronic obstructive pulmonary disease, unspecified: Secondary | ICD-10-CM | POA: Diagnosis not present

## 2021-06-14 DIAGNOSIS — Z9181 History of falling: Secondary | ICD-10-CM | POA: Diagnosis not present

## 2021-06-14 DIAGNOSIS — D72819 Decreased white blood cell count, unspecified: Secondary | ICD-10-CM | POA: Diagnosis not present

## 2021-06-17 DIAGNOSIS — R188 Other ascites: Secondary | ICD-10-CM | POA: Diagnosis not present

## 2021-06-17 DIAGNOSIS — K7460 Unspecified cirrhosis of liver: Secondary | ICD-10-CM | POA: Diagnosis not present

## 2021-06-18 DIAGNOSIS — D696 Thrombocytopenia, unspecified: Secondary | ICD-10-CM | POA: Diagnosis not present

## 2021-06-18 DIAGNOSIS — D72819 Decreased white blood cell count, unspecified: Secondary | ICD-10-CM | POA: Diagnosis not present

## 2021-06-18 DIAGNOSIS — E119 Type 2 diabetes mellitus without complications: Secondary | ICD-10-CM | POA: Diagnosis not present

## 2021-06-18 DIAGNOSIS — I10 Essential (primary) hypertension: Secondary | ICD-10-CM | POA: Diagnosis not present

## 2021-06-18 DIAGNOSIS — J449 Chronic obstructive pulmonary disease, unspecified: Secondary | ICD-10-CM | POA: Diagnosis not present

## 2021-06-18 DIAGNOSIS — E039 Hypothyroidism, unspecified: Secondary | ICD-10-CM | POA: Diagnosis not present

## 2021-06-18 DIAGNOSIS — Z9181 History of falling: Secondary | ICD-10-CM | POA: Diagnosis not present

## 2021-06-18 DIAGNOSIS — Z8505 Personal history of malignant neoplasm of liver: Secondary | ICD-10-CM | POA: Diagnosis not present

## 2021-06-18 DIAGNOSIS — G4700 Insomnia, unspecified: Secondary | ICD-10-CM | POA: Diagnosis not present

## 2021-06-21 DIAGNOSIS — R188 Other ascites: Secondary | ICD-10-CM | POA: Diagnosis not present

## 2021-06-26 DIAGNOSIS — C220 Liver cell carcinoma: Secondary | ICD-10-CM | POA: Diagnosis not present

## 2021-06-27 DIAGNOSIS — D72819 Decreased white blood cell count, unspecified: Secondary | ICD-10-CM | POA: Diagnosis not present

## 2021-06-27 DIAGNOSIS — K7460 Unspecified cirrhosis of liver: Secondary | ICD-10-CM | POA: Diagnosis not present

## 2021-06-27 DIAGNOSIS — J449 Chronic obstructive pulmonary disease, unspecified: Secondary | ICD-10-CM | POA: Diagnosis not present

## 2021-06-27 DIAGNOSIS — D696 Thrombocytopenia, unspecified: Secondary | ICD-10-CM | POA: Diagnosis not present

## 2021-06-27 DIAGNOSIS — G4700 Insomnia, unspecified: Secondary | ICD-10-CM | POA: Diagnosis not present

## 2021-06-27 DIAGNOSIS — Z9181 History of falling: Secondary | ICD-10-CM | POA: Diagnosis not present

## 2021-06-27 DIAGNOSIS — R188 Other ascites: Secondary | ICD-10-CM | POA: Diagnosis not present

## 2021-06-27 DIAGNOSIS — Z8505 Personal history of malignant neoplasm of liver: Secondary | ICD-10-CM | POA: Diagnosis not present

## 2021-06-27 DIAGNOSIS — I10 Essential (primary) hypertension: Secondary | ICD-10-CM | POA: Diagnosis not present

## 2021-06-27 DIAGNOSIS — E039 Hypothyroidism, unspecified: Secondary | ICD-10-CM | POA: Diagnosis not present

## 2021-06-27 DIAGNOSIS — E119 Type 2 diabetes mellitus without complications: Secondary | ICD-10-CM | POA: Diagnosis not present

## 2021-07-03 DIAGNOSIS — R188 Other ascites: Secondary | ICD-10-CM | POA: Diagnosis not present

## 2021-07-08 ENCOUNTER — Other Ambulatory Visit: Payer: Self-pay | Admitting: Gastroenterology

## 2021-07-08 DIAGNOSIS — R188 Other ascites: Secondary | ICD-10-CM | POA: Diagnosis not present

## 2021-07-08 DIAGNOSIS — K7031 Alcoholic cirrhosis of liver with ascites: Secondary | ICD-10-CM

## 2021-07-08 DIAGNOSIS — K7460 Unspecified cirrhosis of liver: Secondary | ICD-10-CM | POA: Diagnosis not present

## 2021-07-12 DIAGNOSIS — K7460 Unspecified cirrhosis of liver: Secondary | ICD-10-CM | POA: Diagnosis not present

## 2021-07-12 DIAGNOSIS — R188 Other ascites: Secondary | ICD-10-CM | POA: Diagnosis not present

## 2021-07-17 ENCOUNTER — Ambulatory Visit
Admission: RE | Admit: 2021-07-17 | Discharge: 2021-07-17 | Disposition: A | Discharge status: Home / Self Care | Payer: Medicare Other | Source: Ambulatory Visit | Attending: Gastroenterology | Admitting: Gastroenterology

## 2021-07-17 ENCOUNTER — Encounter: Payer: Self-pay | Admitting: *Deleted

## 2021-07-17 ENCOUNTER — Other Ambulatory Visit: Payer: Self-pay

## 2021-07-17 DIAGNOSIS — R188 Other ascites: Secondary | ICD-10-CM | POA: Diagnosis not present

## 2021-07-17 DIAGNOSIS — K766 Portal hypertension: Secondary | ICD-10-CM | POA: Diagnosis not present

## 2021-07-17 DIAGNOSIS — K7031 Alcoholic cirrhosis of liver with ascites: Secondary | ICD-10-CM

## 2021-07-17 HISTORY — PX: IR RADIOLOGIST EVAL & MGMT: IMG5224

## 2021-07-17 NOTE — Consult Note (Signed)
Chief Complaint: Patient was seen in consultation today for portal hypertension  Referring Physician(s): David Griffin  Patient Status: David Griffin - Out-pt  History of Present Illness: David Griffin is a 60 y.o. male with history of alcoholic cirrhosis complicated by hepatocellular carcinoma status post prior TACE at [facility], treated HCV, and well known to our service due to twice a week large volume paracenteses at [facility].  He averages around 5-7 (max 9) liters with each paracentesis.  The frequency of these is lifestyle limiting, and he has become very sensitive to the needle sticks.  He has a remote history of hepatic encephalopathy, but this has been well controlled with lactulose, though he admits that he doesn't take it as frequently as prescribed.  Denies any recent encephalopathy.  He has remained sober for 8+ years.  He is currently taking Lasix, no spironolactone.    He is hopeful to undergo TIPS soon.  Past Medical History:  Diagnosis Date   Diabetes mellitus without complication ([place])    Hypertension    Thyroid disease     Past Surgical History:  Procedure Laterality Date   ANKLE SURGERY     BACK SURGERY      Allergies: Patient has no known allergies.  Medications: Prior to Admission medications   Medication Sig Start Date End Date Taking? Authorizing Provider  ALPRAZolam David Griffin) 1 MG tablet Take 1 mg by mouth 2 (two) times daily as needed for anxiety.    [provider]  HYDROcodone-acetaminophen (NORCO) 10-325 MG tablet Take 0.5 tablets by mouth daily as needed for moderate pain.    [provider]  levothyroxine (SYNTHROID, LEVOTHROID) 300 MCG tablet Take 300 mcg by mouth daily before breakfast.    [provider]  lisinopril-hydrochlorothiazide (PRINZIDE,ZESTORETIC) 20-12.5 MG tablet Take 1 tablet by mouth 2 (two) times daily.    [provider]  MAGNESIUM PO Take 1 tablet by mouth daily.    [provider]  metFORMIN (GLUCOPHAGE) 500 MG tablet Take 1,000 mg by mouth 2 (two) times daily with a meal.    [provider]  Multiple Vitamin (MULTIVITAMIN WITH MINERALS) TABS tablet Take 1 tablet by mouth daily.    [provider]  naproxen (NAPROSYN) 500 MG tablet Take 500 mg by mouth 2 (two) times daily with a meal.    [provider]  ondansetron (ZOFRAN ODT) 8 MG disintegrating tablet Take 1 tablet (8 mg total) by mouth every 8 (eight) hours as needed for nausea or vomiting. 11/09/15   David Schmidt, MD  promethazine (PHENERGAN) 25 MG tablet Take 1 tablet (25 mg total) by mouth every 6 (six) hours as needed for nausea or vomiting. 11/09/15   David Schmidt, MD  RaNITidine HCl (ACID CONTROL PO) Take 1 tablet by mouth daily.    [provider]     No family history on file.  Social History   Socioeconomic History   Marital status: Single    Spouse name: Not on file   Number of children: Not on file   Years of education: Not on file   Highest education level: Not on file  Occupational History   Not on file  Tobacco Use   Smoking status: Every Day   Smokeless tobacco: Not on file  Substance and Sexual Activity   Alcohol use: Yes   Drug use: Yes    Types: Marijuana   Sexual activity: Not on file  Other Topics Concern   Not on file  Social  History Narrative   Not on file   Social Determinants of Health   Financial Resource Strain: Not on file  Food Insecurity: Not on file  Transportation Needs: Not on file  Physical Activity: Not on file  Stress: Not on file  Social Connections: Not on file    Review of Systems: A 12 point ROS discussed and pertinent positives are indicated in the HPI above.  All other systems are negative.   Vital Signs: There were no vitals taken for this visit.  No physical examination in lieu of telephone visit.  Imaging: CTA CAP 03/14/21:  treatment changes in right lobe of liver, no enhancement - LIRADS  TR-Nonviable.  Partial portal thrombus at confluence extending into main portal vein. Esophageal varices.  Anatomy amenable to TIPS creation.  ECHO 04/04/21: SUMMARY The left ventricular size is normal. Left ventricular systolic function is normal. LV ejection fraction = 60-65%. The right ventricle is normal in size and function. The left atrium is mildly dilated. Right atrium not well visualized. There was insufficient TR detected to calculate RV systolic pressure. There is no pericardial effusion. There is no comparison study available.  Labs:  MELD-Na score: 11 at 04/08/2021  8:00 PM MELD score: 11 at 04/08/2021  8:00 PM Calculated from: Serum Creatinine: 1.33 MG/DL at 04/08/2021  8:00 PM Serum Sodium: 133 MMOL/L at 04/08/2021  8:00 PM Total Bilirubin: 0.8 MG/DL (Using min of 1 MG/DL) at 04/06/2021 10:32 PM INR(ratio): 1.18 at 04/06/2021 10:32 PM Age: 47 years   AFP (03/14/21) - 2.98 (stable)     Assessment and Plan: 60 year old male with history of alcoholic cirrhosis (Childs Pugh B, MELD 11), hepatocellular carcinoma status post TACE (currently non-viable),  with portal hypertension and recurrent ascites requiring twice per week large volume paracenteses.  He is amenable for TIPS creation.  Risks and benefits discussed in detail.  Plan for TIPS creation at [facility] with general anesthesia, plan for overnight admission.   Thank you for this interesting consult.  I greatly enjoyed meeting David Griffin and look forward to participating in their care.  A copy of this report was sent to the requesting provider on this date.  Electronically Signed: Suzette Battiest, MD 07/17/2021, 9:58 AM   I spent a total of  40 Minutes  in telephone clinical consultation, greater than 50% of which was counseling/coordinating care for portal hypertension.

## 2021-07-30 ENCOUNTER — Other Ambulatory Visit (HOSPITAL_COMMUNITY): Payer: Self-pay | Admitting: Interventional Radiology

## 2021-07-30 DIAGNOSIS — K7031 Alcoholic cirrhosis of liver with ascites: Secondary | ICD-10-CM

## 2021-07-31 DIAGNOSIS — R188 Other ascites: Secondary | ICD-10-CM | POA: Diagnosis not present

## 2021-08-05 DIAGNOSIS — R188 Other ascites: Secondary | ICD-10-CM | POA: Diagnosis not present

## 2021-08-05 DIAGNOSIS — K7460 Unspecified cirrhosis of liver: Secondary | ICD-10-CM | POA: Diagnosis not present

## 2021-08-05 NOTE — Pre-Procedure Instructions (Signed)
Surgical Instructions    Your procedure is scheduled on Wednesday, August 10th, 2022.  Report to [facility] Main Entrance "A" at 10:00 A.M., then check in with the Admitting office.  Call this number if you have problems the morning of surgery:  [phone]   If you have any questions prior to your surgery date call [phone]: Open Monday-Friday 8am-4pm    Remember:  Do not eat after midnight the night before your surgery  You may drink clear liquids until 09:00 A.M. the morning of your surgery.   Clear liquids allowed are: Water, Non-Citrus Juices (without pulp), Carbonated Beverages, Clear Tea, Black Coffee Only, and Gatorade    Take these medicines the morning of surgery with A SIP OF WATER: carvedilol (COREG) escitalopram (LEXAPRO) gabapentin (NEURONTIN) levothyroxine (SYNTHROID)   As of today, STOP taking any Aspirin (unless otherwise instructed by your surgeon) Aleve, Naproxen, Ibuprofen, Motrin, Advil, Goody's, BC's, all herbal medications, fish oil, and all vitamins.   HOW TO MANAGE YOUR DIABETES BEFORE AND AFTER SURGERY  Why is it important to control my blood sugar before and after surgery? Improving blood sugar levels before and after surgery helps healing and can limit problems. A way of improving blood sugar control is eating a healthy diet by:  Eating less sugar and carbohydrates  Increasing activity/exercise  Talking with your doctor about reaching your blood sugar goals High blood sugars (greater than 180 mg/dL) can raise your risk of infections and slow your recovery, so you will need to focus on controlling your diabetes during the weeks before surgery. Make sure that the doctor who takes care of your diabetes knows about your planned surgery including the date and location.  How do I manage my blood sugar before surgery? Check your blood sugar at least 4 times a day, starting 2 days before surgery, to make sure that the level is not too high or  low.  Check your blood sugar the morning of your surgery when you wake up and every 2 hours until you get to the Short Stay unit.  If your blood sugar is less than 70 mg/dL, you will need to treat for low blood sugar: Treat a low blood sugar (less than 70 mg/dL) with  cup of clear juice (cranberry or apple), 4 glucose tablets, OR glucose gel. Recheck blood sugar in 15 minutes after treatment (to make sure it is greater than 70 mg/dL). If your blood sugar is not greater than 70 mg/dL on recheck, call [phone] for further instructions. Report your blood sugar to the short stay nurse when you get to Short Stay.  If you are admitted to the hospital after surgery: Your blood sugar will be checked by the staff and you will probably be given insulin after surgery (instead of oral diabetes medicines) to make sure you have good blood sugar levels. The goal for blood sugar control after surgery is 80-180 mg/dL.           Do not wear jewelry  Do not wear lotions, powders, colognes, or deodorant. Men may shave face and neck. Do not bring valuables to the hospital.             [facility] is not responsible for any belongings or valuables.  Do NOT Smoke (Tobacco/Vaping) or drink Alcohol 24 hours prior to your procedure If you use a CPAP at night, you may bring all equipment for your overnight stay.   Contacts, glasses, dentures or bridgework may not be worn into surgery,  please bring cases for these belongings   For patients admitted to the hospital, discharge time will be determined by your treatment team.   Patients discharged the day of surgery will not be allowed to drive home, and someone needs to stay with them for 24 hours.  ONLY 1 SUPPORT PERSON MAY BE PRESENT WHILE YOU ARE IN SURGERY. IF YOU ARE TO BE ADMITTED ONCE YOU ARE IN YOUR ROOM YOU WILL BE ALLOWED TWO (2) VISITORS.  Minor children may have two parents present. Special consideration for safety and communication needs will be  reviewed on a case by case basis.  Special instructions:    Oral Hygiene is also important to reduce your risk of infection.  Remember - BRUSH YOUR TEETH THE MORNING OF SURGERY WITH YOUR REGULAR TOOTHPASTE   [facility]- Preparing For Surgery  Before surgery, you can play an important role. Because skin is not sterile, your skin needs to be as free of germs as possible. You can reduce the number of germs on your skin by washing with CHG (chlorahexidine gluconate) Soap before surgery.  CHG is an antiseptic cleaner which kills germs and bonds with the skin to continue killing germs even after washing.     Please do not use if you have an allergy to CHG or antibacterial soaps. If your skin becomes reddened/irritated stop using the CHG.  Do not shave (including legs and underarms) for at least 48 hours prior to first CHG shower. It is OK to shave your face.  Please follow these instructions carefully.     Shower the NIGHT BEFORE SURGERY and the MORNING OF SURGERY with CHG Soap.   If you chose to wash your hair, wash your hair first as usual with your normal shampoo. After you shampoo, rinse your hair and body thoroughly to remove the shampoo.  Then [employer] and genitals (private parts) with your normal soap and rinse thoroughly to remove soap.  After that Use CHG Soap as you would any other liquid soap. You can apply CHG directly to the skin and wash gently with a scrungie or a clean washcloth.   Apply the CHG Soap to your body ONLY FROM THE NECK DOWN.  Do not use on open wounds or open sores. Avoid contact with your eyes, ears, mouth and genitals (private parts). Wash Face and genitals (private parts)  with your normal soap.   Wash thoroughly, paying special attention to the area where your surgery will be performed.  Thoroughly rinse your body with warm water from the neck down.  DO NOT shower/wash with your normal soap after using and rinsing off the CHG Soap.  Pat yourself dry with a  CLEAN TOWEL.  Wear CLEAN PAJAMAS to bed the night before surgery  Place CLEAN SHEETS on your bed the night before your surgery  DO NOT SLEEP WITH PETS.   Day of Surgery:  Take a shower with CHG soap. Wear Clean/Comfortable clothing the morning of surgery Do not apply any deodorants/lotions.   Remember to brush your teeth WITH YOUR REGULAR TOOTHPASTE.   Please read over the following fact sheets that you were given.

## 2021-08-06 ENCOUNTER — Encounter (HOSPITAL_COMMUNITY): Payer: Self-pay | Admitting: Physician Assistant

## 2021-08-06 ENCOUNTER — Encounter (HOSPITAL_COMMUNITY): Payer: Self-pay

## 2021-08-06 ENCOUNTER — Other Ambulatory Visit: Payer: Self-pay | Admitting: Radiology

## 2021-08-06 ENCOUNTER — Other Ambulatory Visit: Payer: Self-pay

## 2021-08-06 ENCOUNTER — Encounter (HOSPITAL_COMMUNITY)
Admission: RE | Admit: 2021-08-06 | Discharge: 2021-08-06 | Disposition: A | Discharge status: Home / Self Care | Payer: Medicare Other | Source: Ambulatory Visit | Attending: Interventional Radiology | Admitting: Interventional Radiology

## 2021-08-06 DIAGNOSIS — K766 Portal hypertension: Secondary | ICD-10-CM | POA: Diagnosis not present

## 2021-08-06 DIAGNOSIS — D649 Anemia, unspecified: Secondary | ICD-10-CM | POA: Diagnosis not present

## 2021-08-06 DIAGNOSIS — R188 Other ascites: Secondary | ICD-10-CM | POA: Diagnosis not present

## 2021-08-06 DIAGNOSIS — C220 Liver cell carcinoma: Secondary | ICD-10-CM | POA: Diagnosis not present

## 2021-08-06 DIAGNOSIS — U071 COVID-19: Secondary | ICD-10-CM | POA: Insufficient documentation

## 2021-08-06 DIAGNOSIS — Z01812 Encounter for preprocedural laboratory examination: Secondary | ICD-10-CM | POA: Insufficient documentation

## 2021-08-06 DIAGNOSIS — K7030 Alcoholic cirrhosis of liver without ascites: Secondary | ICD-10-CM | POA: Diagnosis not present

## 2021-08-06 HISTORY — DX: Unspecified osteoarthritis, unspecified site: M19.90

## 2021-08-06 HISTORY — DX: Other psychoactive substance abuse, uncomplicated: F19.10

## 2021-08-06 HISTORY — DX: Depression, unspecified: F32.A

## 2021-08-06 HISTORY — DX: Malignant (primary) neoplasm, unspecified: C80.1

## 2021-08-06 HISTORY — DX: Anemia, unspecified: D64.9

## 2021-08-06 LAB — COMPREHENSIVE METABOLIC PANEL
ALT: 22 U/L (ref 0–44)
AST: 25 U/L (ref 15–41)
Albumin: 2.8 g/dL — ABNORMAL LOW (ref 3.5–5.0)
Alkaline Phosphatase: 88 U/L (ref 38–126)
Anion gap: 4 — ABNORMAL LOW (ref 5–15)
BUN: 30 mg/dL — ABNORMAL HIGH (ref 6–20)
CO2: 28 mmol/L (ref 22–32)
Calcium: 8.2 mg/dL — ABNORMAL LOW (ref 8.9–10.3)
Chloride: 102 mmol/L (ref 98–111)
Creatinine, Ser: 1.34 mg/dL — ABNORMAL HIGH (ref 0.61–1.24)
GFR, Estimated: >60 mL/min (ref >60)
Glucose, Bld: 120 mg/dL — ABNORMAL HIGH (ref 70–99)
Potassium: 3.9 mmol/L (ref 3.5–5.1)
Sodium: 134 mmol/L — ABNORMAL LOW (ref 135–145)
Total Bilirubin: 0.9 mg/dL (ref 0.3–1.2)
Total Protein: 6.3 g/dL — ABNORMAL LOW (ref 6.5–8.1)

## 2021-08-06 LAB — CBC
HCT: 33 % — ABNORMAL LOW (ref 39.0–52.0)
Hemoglobin: 10.4 g/dL — ABNORMAL LOW (ref 13.0–17.0)
MCH: 28.3 pg (ref 26.0–34.0)
MCHC: 31.5 g/dL (ref 30.0–36.0)
MCV: 89.9 fL (ref 80.0–100.0)
Platelets: UNDETERMINED 10*3/uL (ref 150–400)
RBC: 3.67 MIL/uL — ABNORMAL LOW (ref 4.22–5.81)
RDW: 16.9 % — ABNORMAL HIGH (ref 11.5–15.5)
WBC: 2.6 10*3/uL — ABNORMAL LOW (ref 4.0–10.5)
nRBC: 0 % (ref 0.0–0.2)

## 2021-08-06 LAB — HEMOGLOBIN A1C
Hgb A1c MFr Bld: 5.3 % (ref 4.8–5.6)
Mean Plasma Glucose: 105.41 mg/dL

## 2021-08-06 LAB — GLUCOSE, CAPILLARY: Glucose-Capillary: 129 mg/dL — ABNORMAL HIGH (ref 70–99)

## 2021-08-06 LAB — SARS CORONAVIRUS 2 (TAT 6-24 HRS): SARS Coronavirus 2: POSITIVE — AB

## 2021-08-06 LAB — SURGICAL PCR SCREEN
MRSA, PCR: NEGATIVE
Staphylococcus aureus: NEGATIVE

## 2021-08-06 NOTE — Progress Notes (Signed)
Anesthesia Chart Review:  History of alcoholic cirrhosis David Griffin, MELD 11 - Per Dr. Malachi Carl consult note 07/17/2021), hepatocellular carcinoma status post TACE (currently non-viable),  with portal hypertension and recurrent ascites requiring twice per week large volume paracenteses (5-7 L, these have been done at [facility]).  Recent admission at [facility] April 2022 for spontaneous intraventricular hemorrhage thought secondary to patient's underlying coagulopathy. EVD was placed by NSG, subsequently removed 04/11/21. Patient remained stable thereafter. On 04/12/21, the patient was deemed medically stable for discharge patient was given close outpatient PCP follow-up.  CT head (4/11): interval decrease in size of ventriculomegaly and near-total resolution of the IVH.  CT head (4/14): similar size of the ventricular system.  Of note, toxicology results showed patient was positive for cocaine and THC on admission.  Preop labs reviewed, transaminases WNL, creatinine mildly elevated 1.34, albumin low 2.8, mild anemia with hemoglobin 10.4.  Platelets clumped on sample.  Review of recent labs in care everywhere shows baseline platelets around 60 K.  Will repeat platelets on day of surgery.  TTE 04/04/2021 (Care Everywhere): SUMMARY  TDS - Uncooperative patient.  The left ventricular size is normal.  Left ventricular systolic function is normal.  LV ejection fraction = 60-65%.  The right ventricle is normal in size and function.  The left atrium is mildly dilated.  Right atrium not well visualized.  There was insufficient TR detected to calculate RV systolic pressure.  There is no pericardial effusion.  There is no comparison study available.    Wynonia Musty [facility] Short Stay Center/Anesthesiology Phone [phone] 08/06/2021 11:36 AM

## 2021-08-06 NOTE — Progress Notes (Signed)
PCP: Heide Scales, NP Cardiologist: Denies  EKG: 04/03/21 - requested tracing CXR: 04/03/21 ECHO: 04/04/21 Stress Test: Denies Cardiac Cath: Denies  Fasting Blood Sugar- Does not check Checks Blood Sugar__0_ times a day No longer takes diabetes meds  OSA: Yes CPAP: No  ASA/Blood Thinner: No  Covid test 08/06/21 at PAT visilt  Anesthesia Review: Yes, patient states was hospitalized in April 2022 for brain bleed.   Patient denies shortness of breath, fever, cough, and chest pain at PAT appointment.  Patient verbalized understanding of instructions provided today at the PAT appointment.  Patient asked to review instructions at home and day of surgery.

## 2021-08-06 NOTE — Progress Notes (Signed)
Paged MD to make aware of covid test results. Awaiting response back.

## 2021-08-06 NOTE — Progress Notes (Signed)
Dr. Serafina Royals called back and stated to call patient and give him results of Covid positive PCR. Also stated to make aware that office would reach out to him tomorrow to reschedule procedure. TIPS procedure for tomorrow cancelled, OR made aware.

## 2021-08-06 NOTE — Progress Notes (Signed)
Patient called and made aware of Covid positive PCR and TIPS procedure cancelled at this time. Made aware that office would call him tomorrow per Dr. Serafina Royals to reschedule.

## 2021-08-06 NOTE — Anesthesia Preprocedure Evaluation (Deleted)
Anesthesia Evaluation    Airway        Dental   Pulmonary Current Smoker,           Cardiovascular hypertension,      Neuro/Psych    GI/Hepatic   Endo/Other  diabetes  Renal/GU      Musculoskeletal   Abdominal   Peds  Hematology   Anesthesia Other Findings   Reproductive/Obstetrics                             Anesthesia Physical Anesthesia Plan  ASA:   Anesthesia Plan:    Post-op Pain Management:    Induction:   PONV Risk Score and Plan:   Airway Management Planned:   Additional Equipment:   Intra-op Plan:   Post-operative Plan:   Informed Consent:   Plan Discussed with:   Anesthesia Plan Comments: (PAT note by Karoline Caldwell, PA-C:  History ofalcoholic cirrhosis Baird Lyons, MELD 11 - Per Dr. Malachi Carl consult note 07/17/2021), hepatocellular carcinoma status post TACE (currently non-viable), with portal hypertension and recurrent ascites requiring twice per week large volume paracenteses (5-7 L, these have been done at [facility]).  Recent admission at [facility] April 2022 for spontaneous intraventricular hemorrhage thought secondary to patient's underlying coagulopathy. EVD was placed by NSG, subsequently removed 04/11/21. Patient remained stable thereafter. On 04/12/21, the patient was deemed medically stable for discharge patient was given close outpatient PCP follow-up.  CT head (4/11): interval decrease in size of ventriculomegaly and near-total resolution of the IVH.  CT head (4/14): similar size of the ventricular system.  Of note, toxicology results showed patient was positive for cocaine and THC on admission.  Preop labs reviewed, transaminases WNL, creatinine mildly elevated 1.34, albumin low 2.8, mild anemia with hemoglobin 10.4.  Platelets clumped on sample.  Review of recent labs in care everywhere shows baseline platelets around 60 K.  Will repeat platelets on day  of surgery.  TTE 04/04/2021 (Care Everywhere): SUMMARY  TDS - Uncooperative patient.  The left ventricular size is normal.  Left ventricular systolic function is normal.  LV ejection fraction = 60-65%.  The right ventricle is normal in size and function.  The left atrium is mildly dilated.  Right atrium not well visualized.  There was insufficient TR detected to calculate RV systolic pressure.  There is no pericardial effusion.  There is no comparison study available.   )        Anesthesia Quick Evaluation

## 2021-08-07 ENCOUNTER — Inpatient Hospital Stay (HOSPITAL_COMMUNITY)
Admission: RE | Admit: 2021-08-07 | Payer: Medicare Other | Source: Home / Self Care | Admitting: Interventional Radiology

## 2021-08-07 ENCOUNTER — Ambulatory Visit (HOSPITAL_COMMUNITY)
Admission: RE | Admit: 2021-08-07 | Discharge: 2021-08-07 | Disposition: A | Discharge status: Home / Self Care | Payer: Medicare Other | Source: Ambulatory Visit | Attending: Interventional Radiology | Admitting: Interventional Radiology

## 2021-08-07 ENCOUNTER — Encounter (HOSPITAL_COMMUNITY): Payer: Self-pay

## 2021-08-07 ENCOUNTER — Encounter (HOSPITAL_COMMUNITY): Admission: RE | Payer: Self-pay | Source: Home / Self Care

## 2021-08-07 SURGERY — IR WITH ANESTHESIA
Anesthesia: General

## 2021-08-09 DIAGNOSIS — R188 Other ascites: Secondary | ICD-10-CM | POA: Diagnosis not present

## 2021-08-09 DIAGNOSIS — K7469 Other cirrhosis of liver: Secondary | ICD-10-CM | POA: Diagnosis not present

## 2021-08-28 DIAGNOSIS — R188 Other ascites: Secondary | ICD-10-CM | POA: Diagnosis not present

## 2021-08-28 DIAGNOSIS — K7460 Unspecified cirrhosis of liver: Secondary | ICD-10-CM | POA: Diagnosis not present

## 2021-09-03 DIAGNOSIS — R188 Other ascites: Secondary | ICD-10-CM | POA: Diagnosis not present

## 2021-09-04 ENCOUNTER — Other Ambulatory Visit (HOSPITAL_COMMUNITY): Payer: Self-pay | Admitting: Physician Assistant

## 2021-09-04 ENCOUNTER — Encounter (HOSPITAL_COMMUNITY): Payer: Self-pay | Admitting: Interventional Radiology

## 2021-09-04 DIAGNOSIS — I10 Essential (primary) hypertension: Secondary | ICD-10-CM | POA: Diagnosis not present

## 2021-09-04 DIAGNOSIS — E119 Type 2 diabetes mellitus without complications: Secondary | ICD-10-CM | POA: Diagnosis not present

## 2021-09-04 DIAGNOSIS — E039 Hypothyroidism, unspecified: Secondary | ICD-10-CM | POA: Diagnosis not present

## 2021-09-04 DIAGNOSIS — R799 Abnormal finding of blood chemistry, unspecified: Secondary | ICD-10-CM | POA: Diagnosis not present

## 2021-09-04 DIAGNOSIS — D72819 Decreased white blood cell count, unspecified: Secondary | ICD-10-CM | POA: Diagnosis not present

## 2021-09-04 NOTE — Progress Notes (Signed)
DUE TO COVID-19 ONLY ONE VISITOR IS ALLOWED TO COME WITH YOU AND STAY IN THE WAITING ROOM ONLY DURING PRE OP AND PROCEDURE DAY OF SURGERY.   Two  VISITORS  MAY VISIT WITH YOU AFTER SURGERY IN YOUR PRIVATE ROOM DURING VISITING HOURS ONLY!  PCP - Heide Scales, NP Cardiologist - [place], PA-C Hematology/Oncology-Dr Heide Guile  Chest x-ray - 04/03/21 EKG - 04/03/21 CE - tracing on chart Stress Test - n/a ECHO - 04/04/21 Cardiac Cath - n/a  ICD Pacemaker/Loop - n/a  Sleep Study -  Yes CPAP - does not use cpap  Fasting Blood Sugar - does not check Checks Blood Sugar 0 times a day DM - no meds, diet controlled  ASA/Blood Thinner - No  Anesthesia review: Yes  STOP now taking any Aspirin (unless otherwise instructed by your surgeon), Aleve, Naproxen, Ibuprofen, Motrin, Advil, Goody's, BC's, all herbal medications, fish oil, and all vitamins.   Coronavirus Screening Covid test on  08/06/21 was positive  Do you have any of the following symptoms:  Cough yes/no: No Fever (>100.22F)  yes/no: No Runny nose yes/no: No Sore throat yes/no: No Difficulty breathing/shortness of breath  yes/no: No  Have you traveled in the last 14 days and where? yes/no: No  Patient verbalized understanding of instructions that were given via phone.

## 2021-09-04 NOTE — Progress Notes (Signed)
DUE TO COVID-19 ONLY ONE VISITOR IS ALLOWED TO COME WITH YOU AND STAY IN THE WAITING ROOM ONLY DURING PRE OP AND PROCEDURE DAY OF SURGERY.  Two  VISITORS  MAY VISIT WITH YOU AFTER SURGERY IN YOUR PRIVATE ROOM DURING VISITING HOURS ONLY!  PCP - Heide Scales, NP Cardiologist - [place], PA-C Hematology/Oncology - Dr Dorie Rank  Chest x-ray - 04/03/21  EKG -04/03/21 -Tracing requested 08/06/21 Stress Test - n/a ECHO - 04/04/21 Cardiac Cath - n/a  ICD Pacemaker/Loop - n/a  Sleep Study -  Yes CPAP - does not use cpap  DM - no meds, diet controlled Fasting Blood Sugar - does not check Checks Blood Sugar 0 times a day  Anesthesia review: Yes  STOP now taking any Aspirin (unless otherwise instructed by your surgeon), Aleve, Naproxen, Ibuprofen, Motrin, Advil, Goody's, BC's, all herbal medications, fish oil, and all vitamins.   Coronavirus Screening Covid test on 08/06/21 was positive. Do you have any of the following symptoms:  Cough yes/no: No Fever (>100.47F)  yes/no: No Runny nose yes/no: No Sore throat yes/no: No Difficulty breathing/shortness of breath  yes/no: No  Have you traveled in the last 14 days and where? yes/no: No  Patient verbalized understanding of instructions that were given via phone.

## 2021-09-05 ENCOUNTER — Encounter (HOSPITAL_COMMUNITY): Payer: Self-pay | Admitting: Interventional Radiology

## 2021-09-05 ENCOUNTER — Other Ambulatory Visit: Payer: Self-pay

## 2021-09-05 ENCOUNTER — Encounter (HOSPITAL_COMMUNITY): Payer: Self-pay

## 2021-09-05 ENCOUNTER — Observation Stay (HOSPITAL_COMMUNITY)
Admission: RE | Admit: 2021-09-05 | Discharge: 2021-09-05 | Disposition: A | Discharge status: Home / Self Care | Payer: Medicare Other | Source: Ambulatory Visit | Attending: Interventional Radiology | Admitting: Interventional Radiology

## 2021-09-05 ENCOUNTER — Ambulatory Visit (HOSPITAL_COMMUNITY): Payer: Medicare Other | Admitting: Physician Assistant

## 2021-09-05 ENCOUNTER — Encounter (HOSPITAL_COMMUNITY)
Admission: RE | Disposition: A | Discharge status: Home / Self Care | Payer: Self-pay | Source: Ambulatory Visit | Attending: Interventional Radiology

## 2021-09-05 ENCOUNTER — Observation Stay (HOSPITAL_COMMUNITY)
Admission: RE | Admit: 2021-09-05 | Discharge: 2021-09-06 | Disposition: A | Discharge status: Home / Self Care | Payer: Medicare Other | Source: Ambulatory Visit | Attending: Interventional Radiology | Admitting: Interventional Radiology

## 2021-09-05 DIAGNOSIS — Z95828 Presence of other vascular implants and grafts: Secondary | ICD-10-CM

## 2021-09-05 DIAGNOSIS — K7460 Unspecified cirrhosis of liver: Secondary | ICD-10-CM | POA: Diagnosis not present

## 2021-09-05 DIAGNOSIS — K7031 Alcoholic cirrhosis of liver with ascites: Principal | ICD-10-CM | POA: Insufficient documentation

## 2021-09-05 DIAGNOSIS — F17210 Nicotine dependence, cigarettes, uncomplicated: Secondary | ICD-10-CM | POA: Insufficient documentation

## 2021-09-05 DIAGNOSIS — C220 Liver cell carcinoma: Secondary | ICD-10-CM | POA: Insufficient documentation

## 2021-09-05 DIAGNOSIS — E119 Type 2 diabetes mellitus without complications: Secondary | ICD-10-CM | POA: Insufficient documentation

## 2021-09-05 DIAGNOSIS — Z79899 Other long term (current) drug therapy: Secondary | ICD-10-CM | POA: Insufficient documentation

## 2021-09-05 DIAGNOSIS — G4730 Sleep apnea, unspecified: Secondary | ICD-10-CM | POA: Diagnosis not present

## 2021-09-05 DIAGNOSIS — M1990 Unspecified osteoarthritis, unspecified site: Secondary | ICD-10-CM | POA: Diagnosis not present

## 2021-09-05 DIAGNOSIS — R180 Malignant ascites: Secondary | ICD-10-CM | POA: Diagnosis not present

## 2021-09-05 DIAGNOSIS — I10 Essential (primary) hypertension: Secondary | ICD-10-CM | POA: Diagnosis not present

## 2021-09-05 HISTORY — PX: IR US GUIDE VASC ACCESS RIGHT: IMG2390

## 2021-09-05 HISTORY — PX: IR TIPS: IMG2295

## 2021-09-05 HISTORY — PX: RADIOLOGY WITH ANESTHESIA: SHX6223

## 2021-09-05 HISTORY — DX: Sleep apnea, unspecified: G47.30

## 2021-09-05 HISTORY — PX: IR INTRAVASCULAR ULTRASOUND NON CORONARY: IMG6085

## 2021-09-05 HISTORY — PX: IR PARACENTESIS: IMG2679

## 2021-09-05 LAB — BASIC METABOLIC PANEL
Anion gap: 6 (ref 5–15)
BUN: 36 mg/dL — ABNORMAL HIGH (ref 6–20)
CO2: 27 mmol/L (ref 22–32)
Calcium: 8.2 mg/dL — ABNORMAL LOW (ref 8.9–10.3)
Chloride: 103 mmol/L (ref 98–111)
Creatinine, Ser: 1.44 mg/dL — ABNORMAL HIGH (ref 0.61–1.24)
GFR, Estimated: 56 mL/min — ABNORMAL LOW (ref >60)
Glucose, Bld: 115 mg/dL — ABNORMAL HIGH (ref 70–99)
Potassium: 4 mmol/L (ref 3.5–5.1)
Sodium: 136 mmol/L (ref 135–145)

## 2021-09-05 LAB — CBC
HCT: 32 % — ABNORMAL LOW (ref 39.0–52.0)
Hemoglobin: 10.2 g/dL — ABNORMAL LOW (ref 13.0–17.0)
MCH: 29.1 pg (ref 26.0–34.0)
MCHC: 31.9 g/dL (ref 30.0–36.0)
MCV: 91.4 fL (ref 80.0–100.0)
Platelets: 50 10*3/uL — ABNORMAL LOW (ref 150–400)
RBC: 3.5 MIL/uL — ABNORMAL LOW (ref 4.22–5.81)
RDW: 17.2 % — ABNORMAL HIGH (ref 11.5–15.5)
WBC: 2.6 10*3/uL — ABNORMAL LOW (ref 4.0–10.5)
nRBC: 0 % (ref 0.0–0.2)

## 2021-09-05 LAB — PROTIME-INR
INR: 1.2 (ref 0.8–1.2)
Prothrombin Time: 15.5 seconds — ABNORMAL HIGH (ref 11.4–15.2)

## 2021-09-05 LAB — TYPE AND SCREEN
ABO/RH(D): A NEG
Antibody Screen: NEGATIVE

## 2021-09-05 LAB — GLUCOSE, CAPILLARY
Glucose-Capillary: 115 mg/dL — ABNORMAL HIGH (ref 70–99)
Glucose-Capillary: 128 mg/dL — ABNORMAL HIGH (ref 70–99)

## 2021-09-05 LAB — ABO/RH: ABO/RH(D): A NEG

## 2021-09-05 SURGERY — RADIOLOGY WITH ANESTHESIA
Anesthesia: General

## 2021-09-05 MED ORDER — SUCCINYLCHOLINE CHLORIDE 200 MG/10ML IV SOSY
PREFILLED_SYRINGE | INTRAVENOUS | Status: DC | PRN
  Administered 2021-09-05: 140 mg via INTRAVENOUS

## 2021-09-05 MED ORDER — ALBUTEROL SULFATE HFA 108 (90 BASE) MCG/ACT IN AERS
INHALATION_SPRAY | RESPIRATORY_TRACT | Status: DC | PRN
  Administered 2021-09-05: 4 via RESPIRATORY_TRACT

## 2021-09-05 MED ORDER — LIDOCAINE 2% (20 MG/ML) 5 ML SYRINGE
INTRAMUSCULAR | Status: DC | PRN
Start: 2021-09-05 — End: 2021-09-05
  Administered 2021-09-05: 100 mg via INTRAVENOUS

## 2021-09-05 MED ORDER — FENTANYL CITRATE (PF) 250 MCG/5ML IJ SOLN
INTRAMUSCULAR | Status: DC | PRN
  Administered 2021-09-05 (×2): 50 ug via INTRAVENOUS

## 2021-09-05 MED ORDER — HYDRALAZINE HCL 20 MG/ML IJ SOLN: 10.0000 mg | INTRAMUSCULAR | Status: DC | PRN

## 2021-09-05 MED ORDER — ALBUMIN HUMAN 5 % IV SOLN
12.5000 g | Freq: Once | INTRAVENOUS | Status: AC
  Administered 2021-09-05: 12.5 g via INTRAVENOUS

## 2021-09-05 MED ORDER — SODIUM CHLORIDE 0.9 % IV SOLN
2.0000 g | INTRAVENOUS | Status: AC
  Administered 2021-09-05: 2 g via INTRAVENOUS
  Filled 2021-09-05 (×2): qty 20

## 2021-09-05 MED ORDER — ESCITALOPRAM OXALATE 20 MG PO TABS
20.0000 mg | ORAL_TABLET | Freq: Every day | ORAL | Status: DC
  Administered 2021-09-06: 20 mg via ORAL
  Filled 2021-09-05: qty 1

## 2021-09-05 MED ORDER — HYDROMORPHONE HCL 1 MG/ML IJ SOLN: 0.2500 mg | INTRAMUSCULAR | Status: DC | PRN

## 2021-09-05 MED ORDER — ONDANSETRON HCL 4 MG/2ML IJ SOLN: 4.0000 mg | Freq: Four times a day (QID) | INTRAMUSCULAR | Status: DC | PRN

## 2021-09-05 MED ORDER — ONDANSETRON HCL 4 MG/2ML IJ SOLN: 4.0000 mg | Freq: Once | INTRAMUSCULAR | Status: DC | PRN

## 2021-09-05 MED ORDER — IOHEXOL 240 MG/ML SOLN
50.0000 mL | Freq: Once | INTRAMUSCULAR | Status: AC | PRN
  Administered 2021-09-05: 25 mL via INTRAVENOUS

## 2021-09-05 MED ORDER — SUGAMMADEX SODIUM 200 MG/2ML IV SOLN
INTRAVENOUS | Status: DC | PRN
  Administered 2021-09-05: 300 mg via INTRAVENOUS

## 2021-09-05 MED ORDER — PROPOFOL 10 MG/ML IV BOLUS
INTRAVENOUS | Status: DC | PRN
  Administered 2021-09-05: 130 mg via INTRAVENOUS

## 2021-09-05 MED ORDER — LACTATED RINGERS IV SOLN: INTRAVENOUS | Status: DC

## 2021-09-05 MED ORDER — CHLORHEXIDINE GLUCONATE 0.12 % MT SOLN
15.0000 mL | Freq: Once | OROMUCOSAL | Status: AC
  Administered 2021-09-05: 15 mL via OROMUCOSAL

## 2021-09-05 MED ORDER — OXYCODONE HCL 5 MG PO TABS: 5.0000 mg | ORAL_TABLET | ORAL | Status: DC | PRN

## 2021-09-05 MED ORDER — CARVEDILOL 6.25 MG PO TABS
6.2500 mg | ORAL_TABLET | Freq: Two times a day (BID) | ORAL | Status: DC
  Administered 2021-09-05 – 2021-09-06 (×2): 6.25 mg via ORAL
  Filled 2021-09-05 (×2): qty 1

## 2021-09-05 MED ORDER — SODIUM CHLORIDE 0.9 % IV SOLN: INTRAVENOUS | Status: DC

## 2021-09-05 MED ORDER — EPHEDRINE SULFATE-NACL 50-0.9 MG/10ML-% IV SOSY
PREFILLED_SYRINGE | INTRAVENOUS | Status: DC | PRN
  Administered 2021-09-05: 5 mg via INTRAVENOUS

## 2021-09-05 MED ORDER — ALBUTEROL SULFATE (2.5 MG/3ML) 0.083% IN NEBU: 3.0000 mL | INHALATION_SOLUTION | Freq: Four times a day (QID) | RESPIRATORY_TRACT | Status: DC | PRN

## 2021-09-05 MED ORDER — ALBUMIN HUMAN 5 % IV SOLN: INTRAVENOUS | Status: DC | PRN

## 2021-09-05 MED ORDER — ORAL CARE MOUTH RINSE: 15.0000 mL | Freq: Once | OROMUCOSAL | Status: AC

## 2021-09-05 MED ORDER — LEVOTHYROXINE SODIUM 150 MCG PO TABS
150.0000 ug | ORAL_TABLET | Freq: Every day | ORAL | Status: DC
  Administered 2021-09-06: 150 ug via ORAL
  Filled 2021-09-05: qty 1
  Filled 2021-09-05: qty 2

## 2021-09-05 MED ORDER — LACTATED RINGERS IV SOLN: INTRAVENOUS | Status: DC | PRN

## 2021-09-05 MED ORDER — CHLORHEXIDINE GLUCONATE 0.12 % MT SOLN
OROMUCOSAL | Status: AC
  Filled 2021-09-05: qty 15

## 2021-09-05 MED ORDER — ONDANSETRON HCL 4 MG/2ML IJ SOLN
INTRAMUSCULAR | Status: DC | PRN
  Administered 2021-09-05: 4 mg via INTRAVENOUS

## 2021-09-05 MED ORDER — TRAZODONE HCL 100 MG PO TABS
100.0000 mg | ORAL_TABLET | Freq: Every day | ORAL | Status: DC
  Administered 2021-09-05: 100 mg via ORAL
  Filled 2021-09-05: qty 1

## 2021-09-05 MED ORDER — FUROSEMIDE 40 MG PO TABS: 40.0000 mg | ORAL_TABLET | Freq: Two times a day (BID) | ORAL | Status: DC

## 2021-09-05 MED ORDER — LACTULOSE 10 GM/15ML PO SOLN
30.0000 g | Freq: Three times a day (TID) | ORAL | Status: DC
  Administered 2021-09-05 – 2021-09-06 (×3): 30 g via ORAL
  Filled 2021-09-05 (×3): qty 45

## 2021-09-05 MED ORDER — OXYCODONE HCL 5 MG PO TABS: 10.0000 mg | ORAL_TABLET | ORAL | Status: DC | PRN

## 2021-09-05 MED ORDER — ALBUMIN HUMAN 5 % IV SOLN
INTRAVENOUS | Status: AC
  Administered 2021-09-05: 12.5 g
  Filled 2021-09-05: qty 250

## 2021-09-05 MED ORDER — GABAPENTIN 300 MG PO CAPS
300.0000 mg | ORAL_CAPSULE | Freq: Three times a day (TID) | ORAL | Status: DC
  Administered 2021-09-05 – 2021-09-06 (×3): 300 mg via ORAL
  Filled 2021-09-05 (×3): qty 1

## 2021-09-05 MED ORDER — FENTANYL CITRATE (PF) 100 MCG/2ML IJ SOLN
INTRAMUSCULAR | Status: AC
  Filled 2021-09-05: qty 2

## 2021-09-05 MED ORDER — PHENYLEPHRINE HCL-NACL 20-0.9 MG/250ML-% IV SOLN
INTRAVENOUS | Status: DC | PRN
  Administered 2021-09-05: 20 ug/min via INTRAVENOUS

## 2021-09-05 MED ORDER — FUROSEMIDE 40 MG PO TABS
40.0000 mg | ORAL_TABLET | Freq: Every day | ORAL | Status: DC
  Administered 2021-09-06: 40 mg via ORAL
  Filled 2021-09-05: qty 1

## 2021-09-05 MED ORDER — ADULT MULTIVITAMIN W/MINERALS CH
1.0000 | ORAL_TABLET | Freq: Every day | ORAL | Status: DC
  Administered 2021-09-06: 1 via ORAL
  Filled 2021-09-05: qty 1

## 2021-09-05 MED ORDER — FUROSEMIDE 20 MG PO TABS
20.0000 mg | ORAL_TABLET | Freq: Every day | ORAL | Status: DC
  Administered 2021-09-05: 20 mg via ORAL
  Filled 2021-09-05: qty 1

## 2021-09-05 MED ORDER — ALBUMIN HUMAN 5 % IV SOLN
INTRAVENOUS | Status: AC
  Filled 2021-09-05: qty 250

## 2021-09-05 MED ORDER — ACETAMINOPHEN 325 MG PO TABS: 650.0000 mg | ORAL_TABLET | Freq: Four times a day (QID) | ORAL | Status: DC | PRN

## 2021-09-05 MED ORDER — ROCURONIUM BROMIDE 10 MG/ML (PF) SYRINGE
PREFILLED_SYRINGE | INTRAVENOUS | Status: DC | PRN
  Administered 2021-09-05 (×2): 20 mg via INTRAVENOUS
  Administered 2021-09-05: 40 mg via INTRAVENOUS

## 2021-09-05 MED ORDER — DEXAMETHASONE SODIUM PHOSPHATE 10 MG/ML IJ SOLN
INTRAMUSCULAR | Status: DC | PRN
  Administered 2021-09-05: 10 mg via INTRAVENOUS

## 2021-09-05 NOTE — H&P (Signed)
Chief Complaint: Patient was seen in consultation today for Transjugular intrahepatic portal system shunt placement at the request of H Bonkovsky  Supervising Physician: Ruthann Cancer  Patient Status: [facility] - Out-pt  History of Present Illness: David Griffin is a 60 y.o. male   Dr Serafina Royals consult note [id number]: Alcoholic cirrhosis complicated by hepatocellular carcinoma status post prior TACE at [facility], treated HCV, and well known to our service due to twice a week large volume paracenteses at [facility].  He averages around 5-7 (max 9) liters with each paracentesis.  The frequency of these is lifestyle limiting, and he has become very sensitive to the needle sticks.  He has a remote history of hepatic encephalopathy, but this has been well controlled with lactulose, though he admits that he doesn't take it as frequently as prescribed.  Denies any recent encephalopathy.  He has remained sober for 8+ years.  He is currently taking Lasix, no spironolactone.   60 year old male with history of alcoholic cirrhosis (Childs Pugh B, MELD 11), hepatocellular carcinoma status post TACE (currently non-viable),  with portal hypertension and recurrent ascites requiring twice per week large volume paracenteses.  He is amenable for TIPS creation.  Risks and benefits discussed in detail. Plan for TIPS creation at [facility] with general anesthesia, plan for overnight admission.  Last paracentesis Tuesday per pt 6 L  Anything higher than that makes him ill Denies N/V Abdomen is distended; tight  Scheduled today for TIPS procedure with Dr Serafina Royals    Past Medical History:  Diagnosis Date   Anemia    Arthritis    Cancer ([place])    liver   Depression    Diabetes mellitus without complication ([place])    no meds, diet controlled   Hypertension    Sleep apnea    does not use cpap   Substance abuse ([place])    Thyroid disease     Past Surgical History:  Procedure Laterality Date    ANKLE SURGERY     BACK SURGERY     HERNIA REPAIR     IR RADIOLOGIST EVAL & MGMT  07/17/2021    Allergies: Phenergan [promethazine]  Medications: Prior to Admission medications   Medication Sig Start Date End Date Taking? Authorizing Provider  albuterol (VENTOLIN HFA) 108 (90 Base) MCG/ACT inhaler Inhale 2 puffs into the lungs every 6 (six) hours as needed for wheezing or shortness of breath.    [provider]  carvedilol (COREG) 6.25 MG tablet Take 6.25 mg by mouth 2 (two) times daily.    [provider]  escitalopram (LEXAPRO) 20 MG tablet Take 20 mg by mouth daily.    [provider]  furosemide (LASIX) 20 MG tablet Take 20-40 mg by mouth See admin instructions. Take 40 mg in the morning and 20 mg in the afternoon    [provider]  gabapentin (NEURONTIN) 300 MG capsule Take 300 mg by mouth 3 (three) times daily.    [provider]  lactulose (CHRONULAC) 10 GM/15ML solution Take 30 g by mouth daily as needed for constipation. 03/06/21   [provider]  levothyroxine (SYNTHROID) 150 MCG tablet Take 150 mcg by mouth daily before breakfast.    [provider]  Multiple Vitamin (MULTIVITAMIN WITH MINERALS) TABS tablet Take 1 tablet by mouth daily.    [provider]  traZODone (DESYREL) 100 MG tablet Take 100 mg by mouth at bedtime.    [provider]  History reviewed. No pertinent family history.  Social History   Socioeconomic History   Marital status: Single    Spouse name: Not on file   Number of children: Not on file   Years of education: Not on file   Highest education level: Not on file  Occupational History   Not on file  Tobacco Use   Smoking status: Every Day    Packs/day: 1.00    Types: Cigarettes   Smokeless tobacco: Never  Vaping Use   Vaping Use: Never used  Substance and Sexual Activity   Alcohol use: Not Currently   Drug use: Yes    Types: Marijuana    Comment: daily    Sexual activity: Not on file  Other Topics Concern   Not on file  Social History Narrative   Not on file   Social Determinants of Health   Financial Resource Strain: Not on file  Food Insecurity: Not on file  Transportation Needs: Not on file  Physical Activity: Not on file  Stress: Not on file  Social Connections: Not on file    Review of Systems: A 12 point ROS discussed and pertinent positives are indicated in the HPI above.  All other systems are negative.  Review of Systems  Constitutional:  Positive for activity change and appetite change. Negative for fever.  Respiratory:  Positive for shortness of breath. Negative for cough.   Cardiovascular:  Negative for chest pain.  Gastrointestinal:  Positive for abdominal distention. Negative for abdominal pain.  Neurological:  Positive for weakness.  Psychiatric/Behavioral:  Negative for behavioral problems and confusion.    Vital Signs: There were no vitals taken for this visit.  Physical Exam HENT:     Mouth/Throat:     Mouth: Mucous membranes are moist.  Cardiovascular:     Rate and Rhythm: Normal rate and regular rhythm.     Heart sounds: Normal heart sounds.  Pulmonary:     Effort: Pulmonary effort is normal.     Breath sounds: Normal breath sounds.  Abdominal:     General: There is distension.     Tenderness: There is no abdominal tenderness.  Musculoskeletal:        General: Normal range of motion.  Skin:    General: Skin is warm.  Neurological:     Mental Status: He is alert and oriented to person, place, and time.  Psychiatric:        Behavior: Behavior normal.    Imaging: No results found.  Labs:  CBC: Recent Labs    08/06/21 1000  WBC 2.6*  HGB 10.4*  HCT 33.0*  PLT PLATELET CLUMPS NOTED ON SMEAR, UNABLE TO ESTIMATE    COAGS: No results for input(s): INR, APTT in the last 8760 hours.  BMP: Recent Labs    08/06/21 1000  NA 134*  K 3.9  CL 102  CO2 28  GLUCOSE 120*  BUN 30*   CALCIUM 8.2*  CREATININE 1.34*  GFRNONAA >60    LIVER FUNCTION TESTS: Recent Labs    08/06/21 1000  BILITOT 0.9  AST 25  ALT 22  ALKPHOS 88  PROT 6.3*  ALBUMIN 2.8*    TUMOR MARKERS: No results for input(s): AFPTM, CEA, CA199, CHROMGRNA in the last 8760 hours.  Assessment and Plan:  Scheduled today for Transjugular intrahepatic portal system shunt placement procedure with Dr Serafina Royals Pt is aware he will be admitted post procedure for overnight observation Risks and benefits of TIPS, BRTO and/or additional variceal embolization were  discussed with the patient and/or the patient's family including, but not limited to, infection, bleeding, damage to adjacent structures, worsening hepatic and/or cardiac function, worsening and/or the development of altered mental status/encephalopathy, non-target embolization and death.   This interventional procedure involves the use of X-rays and because of the nature of the planned procedure, it is possible that we will have prolonged use of X-ray fluoroscopy.  Potential radiation risks to you include (but are not limited to) the following: - A slightly elevated risk for cancer  several years later in life. This risk is typically less than 0.5% percent. This risk is low in comparison to the normal incidence of human cancer, which is 33% for women and 50% for men according to the [employer]. - Radiation induced injury can include skin redness, resembling a rash, tissue breakdown / ulcers and hair loss (which can be temporary or permanent).   The likelihood of either of these occurring depends on the difficulty of the procedure and whether you are sensitive to radiation due to previous procedures, disease, or genetic conditions.   IF your procedure requires a prolonged use of radiation, you will be notified and given written instructions for further action.  It is your responsibility to monitor the irradiated area for the 2 weeks  following the procedure and to notify your physician if you are concerned that you have suffered a radiation induced injury.    All of the patient's questions were answered, patient is agreeable to proceed.  Consent signed and in chart.     Thank you for this interesting consult.  I greatly enjoyed meeting ISAC FLORENTINE and look forward to participating in their care.  A copy of this report was sent to the requesting provider on this date.  Electronically Signed: Lavonia Drafts, PA-C 09/05/2021, 6:43 AM   I spent a total of  30 Minutes   in face to face in clinical consultation, greater than 50% of which was counseling/coordinating care for TIPS

## 2021-09-05 NOTE — Anesthesia Postprocedure Evaluation (Signed)
Anesthesia Post Note  Patient: David Griffin  Procedure(s) Performed: RADIOLOGY WITH ANESTHESIA TIPS     Patient location during evaluation: PACU Anesthesia Type: General Level of consciousness: awake and alert Pain management: pain level controlled Vital Signs Assessment: post-procedure vital signs reviewed and stable Respiratory status: spontaneous breathing, nonlabored ventilation, respiratory function stable and patient connected to nasal cannula oxygen Cardiovascular status: blood pressure returned to baseline and stable Postop Assessment: no apparent nausea or vomiting Anesthetic complications: no   No notable events documented.  Last Vitals:  Vitals:   09/05/21 1045 09/05/21 1100  BP: (!) 100/56 (!) 96/50  Pulse: (!) 54 (!) 57  Resp: 16 15  Temp:    SpO2: 99% 93%    Last Pain:  Vitals:   09/05/21 1030  TempSrc:   PainSc: 7                  [name]

## 2021-09-05 NOTE — Anesthesia Procedure Notes (Signed)
Procedure Name: Intubation Date/Time: 09/05/2021 8:29 AM Performed by: Thelma Comp, CRNA Pre-anesthesia Checklist: Patient identified, Emergency Drugs available, Suction available and Patient being monitored Patient Re-evaluated:Patient Re-evaluated prior to induction Oxygen Delivery Method: Circle System Utilized Preoxygenation: Pre-oxygenation with 100% oxygen Induction Type: IV induction, Rapid sequence and Cricoid Pressure applied Ventilation: Mask ventilation without difficulty Laryngoscope Size: Mac and 4 Grade View: Grade I Tube type: Oral Tube size: 7.5 mm Number of attempts: 2 Airway Equipment and Method: Stylet and Oral airway Placement Confirmation: ETT inserted through vocal cords under direct vision, positive ETCO2 and breath sounds checked- equal and bilateral Secured at: 24 cm Tube secured with: Tape Dental Injury: Teeth and Oropharynx as per pre-operative assessment  Comments: Intubation performed by MDA Kalman Shan

## 2021-09-05 NOTE — Transfer of Care (Signed)
Immediate Anesthesia Transfer of Care Note  Patient: Angelyn Punt Weidmann  Procedure(s) Performed: RADIOLOGY WITH ANESTHESIA TIPS  Patient Location: PACU  Anesthesia Type:General  Level of Consciousness: awake, alert  and patient cooperative  Airway & Oxygen Therapy: Patient Spontanous Breathing and Patient connected to face mask oxygen  Post-op Assessment: Report given to RN and Post -op Vital signs reviewed and stable  Post vital signs: Reviewed and stable  Last Vitals:  Vitals Value Taken Time  BP 104/64 09/05/21 1028  Temp    Pulse 56 09/05/21 1034  Resp 15 09/05/21 1034  SpO2 100 % 09/05/21 1034  Vitals shown include unvalidated device data.  Last Pain:  Vitals:   09/05/21 0650  TempSrc:   PainSc: 7       Patients Stated Pain Goal: 2 ([id number])  Complications: No notable events documented.

## 2021-09-05 NOTE — Anesthesia Preprocedure Evaluation (Signed)
Anesthesia Evaluation  Patient identified by MRN, date of birth, ID band Patient awake    Reviewed: Allergy & Precautions, NPO status , Patient's Chart, lab work & pertinent test results  Airway Mallampati: II  TM Distance: >3 FB Neck ROM: Full    Dental no notable dental hx.    Pulmonary neg pulmonary ROS, Current Smoker,    Pulmonary exam normal breath sounds clear to auscultation       Cardiovascular hypertension, Pt. on medications Normal cardiovascular exam Rhythm:Regular Rate:Normal     Neuro/Psych negative neurological ROS  negative psych ROS   GI/Hepatic negative GI ROS, (+) Cirrhosis     substance abuse  alcohol use,   Endo/Other  diabetes  Renal/GU negative Renal ROS  negative genitourinary   Musculoskeletal negative musculoskeletal ROS (+)   Abdominal   Peds negative pediatric ROS (+)  Hematology negative hematology ROS (+)   Anesthesia Other Findings   Reproductive/Obstetrics negative OB ROS                             Anesthesia Physical Anesthesia Plan  ASA: 3  Anesthesia Plan: General   Post-op Pain Management:    Induction: Intravenous  PONV Risk Score and Plan: 1 and Ondansetron and Treatment may vary due to age or medical condition  Airway Management Planned: Oral ETT  Additional Equipment: Arterial line  Intra-op Plan:   Post-operative Plan: Possible Post-op intubation/ventilation  Informed Consent: I have reviewed the patients History and Physical, chart, labs and discussed the procedure including the risks, benefits and alternatives for the proposed anesthesia with the patient or authorized representative who has indicated his/her understanding and acceptance.     Dental advisory given  Plan Discussed with: CRNA and Surgeon  Anesthesia Plan Comments:         Anesthesia Quick Evaluation

## 2021-09-05 NOTE — Anesthesia Procedure Notes (Signed)
Arterial Line Insertion Start/End9/07/2021 7:20 AM, 09/05/2021 7:25 AM Performed by: Betha Loa, CRNA, CRNA  Patient location: OOR procedure area. Preanesthetic checklist: patient identified, IV checked, site marked, risks and benefits discussed, surgical consent, monitors and equipment checked, pre-op evaluation, timeout performed and anesthesia consent Lidocaine 1% used for infiltration Left, radial was placed Catheter size: 20 G Hand hygiene performed  and maximum sterile barriers used   Attempts: 2 Procedure performed without using ultrasound guided technique. Following insertion, dressing applied and Biopatch. Post procedure assessment: normal and unchanged  Patient tolerated the procedure well with no immediate complications.

## 2021-09-05 NOTE — Procedures (Signed)
Interventional Radiology Procedure Note  Procedure:  1) TIPS creation 2) Paracentesis  Findings: Please refer to procedural dictation for full description. Paracentesis from RLQ yielding 8.4 L ascitic fluid.  Middle hepatic vein to left portal vein 6+2 cm Viatorr placed, dilated to 8 mm.    Mean Pressures Pre-TIPS: RA = 1 mmHg, PV = 31 mmHg, Portosystemic gradient = 30 mmHg Post-TIPS: RA = 17 mmHg, PV = 24 mmHg, Portosystemic gradient = 7 mmHg  Complications: None immediate  Estimated Blood Loss: 10 mL  Recommendations: Admit to IR for overnight observation. Advance diet as tolerated. PRN pain meds. Continue home meds, titrate lactulose to 3-4 bowel movements per day. CBC, CMP, INR tomorrow morning.   Ruthann Cancer, MD Pager: [phone]

## 2021-09-05 NOTE — Progress Notes (Signed)
   Called to PACU per RN Pt has systolic BP that is trending down per cuff. 94---90---87 Orders say to call IR or IR PA  Pt has no complaints Drowsy per RN  I have seen and examined pt He is alert yet groggy. Answers all questions appropriately Moving all 4s Abd without pain  Has existing art line with good wave form 120s/70s  IV in place--- RN had increased rate for now   Discussed with Dr Serafina Royals Rec:   Replace IV fluids to keep open only Art line with good wave form-- will use this ready since most reliable  RN did recheck BP with cuff while I was at bedside 94/67  Pt is to Tx to 6N soon Plan for overnight stay DC in am if stable

## 2021-09-05 NOTE — H&P (Deleted)
  The note originally documented on this encounter has been moved the the encounter in which it belongs.  

## 2021-09-06 ENCOUNTER — Encounter (HOSPITAL_COMMUNITY): Payer: Self-pay | Admitting: Interventional Radiology

## 2021-09-06 DIAGNOSIS — K7031 Alcoholic cirrhosis of liver with ascites: Secondary | ICD-10-CM | POA: Diagnosis not present

## 2021-09-06 LAB — COMPREHENSIVE METABOLIC PANEL
ALT: 17 U/L (ref 0–44)
AST: 24 U/L (ref 15–41)
Albumin: 2.4 g/dL — ABNORMAL LOW (ref 3.5–5.0)
Alkaline Phosphatase: 55 U/L (ref 38–126)
Anion gap: 3 — ABNORMAL LOW (ref 5–15)
BUN: 34 mg/dL — ABNORMAL HIGH (ref 6–20)
CO2: 28 mmol/L (ref 22–32)
Calcium: 8.4 mg/dL — ABNORMAL LOW (ref 8.9–10.3)
Chloride: 108 mmol/L (ref 98–111)
Creatinine, Ser: 1.47 mg/dL — ABNORMAL HIGH (ref 0.61–1.24)
GFR, Estimated: 55 mL/min — ABNORMAL LOW (ref >60)
Glucose, Bld: 157 mg/dL — ABNORMAL HIGH (ref 70–99)
Potassium: 3.8 mmol/L (ref 3.5–5.1)
Sodium: 139 mmol/L (ref 135–145)
Total Bilirubin: 0.6 mg/dL (ref 0.3–1.2)
Total Protein: 5.2 g/dL — ABNORMAL LOW (ref 6.5–8.1)

## 2021-09-06 LAB — PROTIME-INR
INR: 1.5 — ABNORMAL HIGH (ref 0.8–1.2)
Prothrombin Time: 17.6 seconds — ABNORMAL HIGH (ref 11.4–15.2)

## 2021-09-06 LAB — CBC
HCT: 28.3 % — ABNORMAL LOW (ref 39.0–52.0)
Hemoglobin: 9.2 g/dL — ABNORMAL LOW (ref 13.0–17.0)
MCH: 29 pg (ref 26.0–34.0)
MCHC: 32.5 g/dL (ref 30.0–36.0)
MCV: 89.3 fL (ref 80.0–100.0)
Platelets: 51 10*3/uL — ABNORMAL LOW (ref 150–400)
RBC: 3.17 MIL/uL — ABNORMAL LOW (ref 4.22–5.81)
RDW: 16.8 % — ABNORMAL HIGH (ref 11.5–15.5)
WBC: 5 10*3/uL (ref 4.0–10.5)
nRBC: 0 % (ref 0.0–0.2)

## 2021-09-06 NOTE — Progress Notes (Signed)
Pt reported some shakiness in his hands to the MD this morning.  Pt stated MD asked if it had happened before. He stated "yes."  No new orders

## 2021-09-06 NOTE — Progress Notes (Signed)
David Griffin to be D/C'd  per MD order.  Discussed with the patient and all questions fully answered.  VSS, Skin clean, dry and intact without evidence of skin break down, no evidence of skin tears noted.  IV catheter discontinued intact. Site without signs and symptoms of complications. Dressing and pressure applied.  An After Visit Summary was printed and given to the patient.  D/c education completed with patient/family including follow up instructions, medication list, d/c activities limitations if indicated, with other d/c instructions as indicated by MD - patient able to verbalize understanding, all questions fully answered.   Patient instructed to return to ED, call 911, or call MD for any changes in condition.   Patient to be escorted via [place], and D/C home via private auto.

## 2021-09-06 NOTE — Discharge Summary (Addendum)
Patient ID: David Griffin MRN: [id number] DOB/AGE: Aug 31, 1961 60 y.o.  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   Discharge Diagnoses:  Active Problems:   S/P TIPS (transjugular intrahepatic portosystemic shunt)  Discharged Condition: good  Hospital Course: Patient presented to [facility] department 09/05/21 for elective TIPS procedure with Dr. Serafina Royals. Patient admitted post-procedure for overnight observation per protocol. Patient had an uneventful evening and has no complaints this morning. He is eating, drinking, ambulating and denies any pain or discomfort. Right IJ site is clean and dry. Patient will follow up with Dr. Serafina Royals at the clinic in approximately 2-4 weeks. A scheduler from our office will call him after discharge to arrange this appointment. The patient was instructed to continue taking his home diuretics and lactulose 30 mg TID (titrate to 4 bowel movements per day). He knows to call our office with any questions/concerns.   Consults: None  Significant Diagnostic Studies: IR Tips  Result Date: 09/05/2021 CLINICAL DATA:  60 year old male with history of alcoholic cirrhosis and refractory ascites requiring large volume paracentesis multiple times per week. MELD = 15. EXAM: 1. Ultrasound-guided paracentesis 2. Ultrasound-guided access of the right internal jugular vein 3. Ultrasound-guided access of the right common femoral vein 4. Hepatic venogram 5. Intravascular ultrasound 6. Catheterization of the portal vein 7. Portal venous and central manometry 8. Portal venogram 9. Creation of a transhepatic portal vein to hepatic vein shunt MEDICATIONS: As antibiotic prophylaxis, Rocephin 1 gm IV was ordered pre-procedure and administered intravenously  within one hour of incision. ANESTHESIA/SEDATION: General - as administered by the Anesthesia department CONTRAST:  Seventy-five ML Omnipaque 300, intravenous FLUOROSCOPY TIME:  Fluoroscopy Time: 10 minutes, 18 seconds (193 mGy). COMPLICATIONS: None immediate. PROCEDURE: Informed written consent was obtained from the patient after a thorough discussion of the procedural risks, benefits and alternatives. All questions were addressed. Maximal Sterile Barrier Technique was utilized including caps, mask, sterile gowns, sterile gloves, sterile drape, hand hygiene and skin antiseptic. A timeout was performed prior to the initiation of the procedure. Ultrasound evaluation of the right lower quadrant demonstrated large volume of ascites. A small skin nick was made at the planned needle entry site. Under direct ultrasound visualization, a 6 French Safe-T-Centesis catheter was directed into the peritoneum and the inner needle was removed. A total of 8.4 L of translucent, straw-colored fluid was removed during this procedure. A preliminary ultrasound of the right neck was performed and demonstrates a patent internal jugular vein. A permanent ultrasound image was recorded. Using a combination of fluoroscopy and ultrasound, an access site was determined. A small dermatotomy was made at the planned puncture site. Using ultrasound guidance, access into the right internal jugular vein was obtained with visualization of needle entry into the vessel using a standard micropuncture technique. A wire was advanced into the IVC and serial fascial dilation performed. A 10 French TIPS sheath was placed into the internal jugular vein and advanced to the IVC. A preliminary ultrasound of the right groin was performed and demonstrates a patent right common femoral vein. A permanent ultrasound image was recorded. Using a combination of fluoroscopy and ultrasound, an access site was determined. A small dermatotomy was made at the planned puncture  site. Using ultrasound guidance, access into the right common femoral vein was obtained with visualization of needle entry into the  vessel using a standard micropuncture technique. A wire was advanced into the IVC insert all fascial dilation performed. An 8 [place], 11 cm vascular sheath was placed into the external iliac vein. Through this access site, an 21 [place] ICE catheter was advanced with ease under fluoroscopic guidance to the level of the intrahepatic inferior vena cava. The jugular sheath was retracted into the right atrium and manometry was performed. A 5 French angled tip catheter was then directed into the middle hepatic vein. Hepatic venogram was performed. These images demonstrated patent hepatic vein with no stenosis. The catheter was advanced to a wedge portion of the a patent vein over which the 10 French sheath was advanced into the right hepatic vein. Using ICE ultrasound visualization the hepatic vein as well as the portal anatomy was defined. A planned exit site from the middle hepatic vein and puncture site from the portal vein was placed into a single sonographic plane. Under direct ultrasound visualization, the Argon ScorpionX needle was advanced into the central left portal vein. Hand injection of contrast confirmed position within the portal system. A Glidewire Advantage was then advanced into the splenic vein. A 5 French marking pigtail catheter was then advanced over the wire into the main portal vein and wire removed. Portal venogram was performed which demonstrated a patent portal vein. 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 Portal manometry was then performed. The tract was then dilated to 8 mm with an 8 mm x 4 mm Atheltis balloon. A 8-10 mm by 6 + 2 mm of Viatorr was placed. This was ultimately dilated to 8 mm. After placement of the shunt, right atrial and portal pressures were repeated. Completion portal venogram demonstrates a patent TIPS with brisk  antegrade portal venous flow. The catheters and sheath were removed and manual compression was applied to the right internal jugular and right common femoral venous access sites until hemostasis was achieved. The patient was transferred to the PACU in stable condition. Pre-TIPS Mean Pressures (mmHg): Right atrium: 1 Portal vein: 31 Portosystemic gradient: 30 Post-TIPS Mean Pressures (mmHg): Right atrium:17 Portal vein: 24 Portosystemic gradient: 7 IMPRESSION: 1. Successful transjugular portosystemic shunt creation. 2. Portosystemic gradient of 30 mm Hg (absolute portal venous pressure 31 mm Hg) before shunt placement and 7 mm Hg (absolute portal venous pressure 24 mm Hg) after shunt placement. 3. Technically successful ultrasound-guided paracentesis from the right lower quadrant yielding 8.4 L of ascitic fluid. PLAN: Admit to IR for overnight observation. Follow-up in IR clinic in 2-4 weeks. Continue paracentesis as an outpatient as needed. Ruthann Cancer, MD [facility] Radiology Electronically Signed   By: Ruthann Cancer M.D.   On: 09/05/2021 10:37   IR US Guide Vasc Access Right  Result Date: 09/05/2021 CLINICAL DATA:  60 year old male with history of alcoholic cirrhosis and refractory ascites requiring large volume paracentesis multiple times per week. MELD = 15. EXAM: 1. Ultrasound-guided paracentesis 2. Ultrasound-guided access of the right internal jugular vein 3. Ultrasound-guided access of the right common femoral vein 4. Hepatic venogram 5. Intravascular ultrasound 6. Catheterization of the portal vein 7. Portal venous and central manometry 8. Portal venogram 9. Creation of a transhepatic portal vein to hepatic vein shunt MEDICATIONS: As antibiotic prophylaxis, Rocephin 1 gm IV was ordered pre-procedure and administered intravenously within one hour of incision. ANESTHESIA/SEDATION: General - as administered by the Anesthesia department CONTRAST:   Seventy-five ML Omnipaque 300, intravenous FLUOROSCOPY TIME:  Fluoroscopy Time: 10 minutes,  18 seconds (193 mGy). COMPLICATIONS: None immediate. PROCEDURE: Informed written consent was obtained from the patient after a thorough discussion of the procedural risks, benefits and alternatives. All questions were addressed. Maximal Sterile Barrier Technique was utilized including caps, mask, sterile gowns, sterile gloves, sterile drape, hand hygiene and skin antiseptic. A timeout was performed prior to the initiation of the procedure. Ultrasound evaluation of the right lower quadrant demonstrated large volume of ascites. A small skin nick was made at the planned needle entry site. Under direct ultrasound visualization, a 6 French Safe-T-Centesis catheter was directed into the peritoneum and the inner needle was removed. A total of 8.4 L of translucent, straw-colored fluid was removed during this procedure. A preliminary ultrasound of the right neck was performed and demonstrates a patent internal jugular vein. A permanent ultrasound image was recorded. Using a combination of fluoroscopy and ultrasound, an access site was determined. A small dermatotomy was made at the planned puncture site. Using ultrasound guidance, access into the right internal jugular vein was obtained with visualization of needle entry into the vessel using a standard micropuncture technique. A wire was advanced into the IVC and serial fascial dilation performed. A 10 French TIPS sheath was placed into the internal jugular vein and advanced to the IVC. A preliminary ultrasound of the right groin was performed and demonstrates a patent right common femoral vein. A permanent ultrasound image was recorded. Using a combination of fluoroscopy and ultrasound, an access site was determined. A small dermatotomy was made at the planned puncture site. Using ultrasound guidance, access into the right common femoral vein was obtained with visualization of  needle entry into the vessel using a standard micropuncture technique. A wire was advanced into the IVC insert all fascial dilation performed. An 8 [place], 11 cm vascular sheath was placed into the external iliac vein. Through this access site, an 73 [place] ICE catheter was advanced with ease under fluoroscopic guidance to the level of the intrahepatic inferior vena cava. The jugular sheath was retracted into the right atrium and manometry was performed. A 5 French angled tip catheter was then directed into the middle hepatic vein. Hepatic venogram was performed. These images demonstrated patent hepatic vein with no stenosis. The catheter was advanced to a wedge portion of the a patent vein over which the 10 French sheath was advanced into the right hepatic vein. Using ICE ultrasound visualization the hepatic vein as well as the portal anatomy was defined. A planned exit site from the middle hepatic vein and puncture site from the portal vein was placed into a single sonographic plane. Under direct ultrasound visualization, the Argon ScorpionX needle was advanced into the central left portal vein. Hand injection of contrast confirmed position within the portal system. A Glidewire Advantage was then advanced into the splenic vein. A 5 French marking pigtail catheter was then advanced over the wire into the main portal vein and wire removed. Portal venogram was performed which demonstrated a patent portal vein. 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 Portal manometry was then performed. The tract was then dilated to 8 mm with an 8 mm x 4 mm Atheltis balloon. A 8-10 mm by 6 + 2 mm of Viatorr was placed. This was ultimately dilated to 8 mm. After placement of the shunt, right atrial and portal pressures were repeated. Completion portal venogram demonstrates a patent TIPS with brisk antegrade portal venous flow. The catheters and sheath were removed and manual compression was applied to  the  right internal jugular and right common femoral venous access sites until hemostasis was achieved. The patient was transferred to the PACU in stable condition. Pre-TIPS Mean Pressures (mmHg): Right atrium: 1 Portal vein: 31 Portosystemic gradient: 30 Post-TIPS Mean Pressures (mmHg): Right atrium:17 Portal vein: 24 Portosystemic gradient: 7 IMPRESSION: 1. Successful transjugular portosystemic shunt creation. 2. Portosystemic gradient of 30 mm Hg (absolute portal venous pressure 31 mm Hg) before shunt placement and 7 mm Hg (absolute portal venous pressure 24 mm Hg) after shunt placement. 3. Technically successful ultrasound-guided paracentesis from the right lower quadrant yielding 8.4 L of ascitic fluid. PLAN: Admit to IR for overnight observation. Follow-up in IR clinic in 2-4 weeks. Continue paracentesis as an outpatient as needed. Ruthann Cancer, MD [facility] Radiology Electronically Signed   By: Ruthann Cancer M.D.   On: 09/05/2021 10:37   IR Paracentesis  Result Date: 09/05/2021 CLINICAL DATA:  60 year old male with history of alcoholic cirrhosis and refractory ascites requiring large volume paracentesis multiple times per week. MELD = 15. EXAM: 1. Ultrasound-guided paracentesis 2. Ultrasound-guided access of the right internal jugular vein 3. Ultrasound-guided access of the right common femoral vein 4. Hepatic venogram 5. Intravascular ultrasound 6. Catheterization of the portal vein 7. Portal venous and central manometry 8. Portal venogram 9. Creation of a transhepatic portal vein to hepatic vein shunt MEDICATIONS: As antibiotic prophylaxis, Rocephin 1 gm IV was ordered pre-procedure and administered intravenously within one hour of incision. ANESTHESIA/SEDATION: General - as administered by the Anesthesia department CONTRAST:  Seventy-five ML Omnipaque 300, intravenous FLUOROSCOPY TIME:  Fluoroscopy Time: 10 minutes, 18 seconds (193 mGy). COMPLICATIONS: None  immediate. PROCEDURE: Informed written consent was obtained from the patient after a thorough discussion of the procedural risks, benefits and alternatives. All questions were addressed. Maximal Sterile Barrier Technique was utilized including caps, mask, sterile gowns, sterile gloves, sterile drape, hand hygiene and skin antiseptic. A timeout was performed prior to the initiation of the procedure. Ultrasound evaluation of the right lower quadrant demonstrated large volume of ascites. A small skin nick was made at the planned needle entry site. Under direct ultrasound visualization, a 6 French Safe-T-Centesis catheter was directed into the peritoneum and the inner needle was removed. A total of 8.4 L of translucent, straw-colored fluid was removed during this procedure. A preliminary ultrasound of the right neck was performed and demonstrates a patent internal jugular vein. A permanent ultrasound image was recorded. Using a combination of fluoroscopy and ultrasound, an access site was determined. A small dermatotomy was made at the planned puncture site. Using ultrasound guidance, access into the right internal jugular vein was obtained with visualization of needle entry into the vessel using a standard micropuncture technique. A wire was advanced into the IVC and serial fascial dilation performed. A 10 French TIPS sheath was placed into the internal jugular vein and advanced to the IVC. A preliminary ultrasound of the right groin was performed and demonstrates a patent right common femoral vein. A permanent ultrasound image was recorded. Using a combination of fluoroscopy and ultrasound, an access site was determined. A small dermatotomy was made at the planned puncture site. Using ultrasound guidance, access into the right common femoral vein was obtained with visualization of needle entry into the vessel using a standard micropuncture technique. A wire was advanced into the IVC insert all fascial dilation  performed. An 8 [place], 11 cm vascular sheath was placed into the external iliac vein. Through this access site, an 8  [place] Accunav ICE catheter was advanced with ease under fluoroscopic guidance to the level of the intrahepatic inferior vena cava. The jugular sheath was retracted into the right atrium and manometry was performed. A 5 French angled tip catheter was then directed into the middle hepatic vein. Hepatic venogram was performed. These images demonstrated patent hepatic vein with no stenosis. The catheter was advanced to a wedge portion of the a patent vein over which the 10 French sheath was advanced into the right hepatic vein. Using ICE ultrasound visualization the hepatic vein as well as the portal anatomy was defined. A planned exit site from the middle hepatic vein and puncture site from the portal vein was placed into a single sonographic plane. Under direct ultrasound visualization, the Argon ScorpionX needle was advanced into the central left portal vein. Hand injection of contrast confirmed position within the portal system. A Glidewire Advantage was then advanced into the splenic vein. A 5 French marking pigtail catheter was then advanced over the wire into the main portal vein and wire removed. Portal venogram was performed which demonstrated a patent portal vein. 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 Portal manometry was then performed. The tract was then dilated to 8 mm with an 8 mm x 4 mm Atheltis balloon. A 8-10 mm by 6 + 2 mm of Viatorr was placed. This was ultimately dilated to 8 mm. After placement of the shunt, right atrial and portal pressures were repeated. Completion portal venogram demonstrates a patent TIPS with brisk antegrade portal venous flow. The catheters and sheath were removed and manual compression was applied to the right internal jugular and right common femoral venous access sites until hemostasis was achieved. The patient was transferred to the  PACU in stable condition. Pre-TIPS Mean Pressures (mmHg): Right atrium: 1 Portal vein: 31 Portosystemic gradient: 30 Post-TIPS Mean Pressures (mmHg): Right atrium:17 Portal vein: 24 Portosystemic gradient: 7 IMPRESSION: 1. Successful transjugular portosystemic shunt creation. 2. Portosystemic gradient of 30 mm Hg (absolute portal venous pressure 31 mm Hg) before shunt placement and 7 mm Hg (absolute portal venous pressure 24 mm Hg) after shunt placement. 3. Technically successful ultrasound-guided paracentesis from the right lower quadrant yielding 8.4 L of ascitic fluid. PLAN: Admit to IR for overnight observation. Follow-up in IR clinic in 2-4 weeks. Continue paracentesis as an outpatient as needed. Ruthann Cancer, MD [facility] Radiology Electronically Signed   By: Ruthann Cancer M.D.   On: 09/05/2021 10:37    Treatments: procedures: TIPS  Discharge Exam: Blood pressure (!) 93/44, pulse 61, temperature 98.5 F (36.9 C), temperature source Oral, resp. rate 16, height 6' 3.5" (1.918 m), weight 239 lb (108.4 kg), SpO2 94 %. Physical Exam Constitutional:      General: He is not in acute distress.    Appearance: He is not ill-appearing.  HENT:     Mouth/Throat:     Mouth: Mucous membranes are moist.     Pharynx: Oropharynx is clear.  Cardiovascular:     Comments: Right IJ vascular site is clean and dry.  Pulmonary:     Effort: Pulmonary effort is normal.  Musculoskeletal:        General: Normal range of motion.  Skin:    General: Skin is warm and dry.  Neurological:     Mental Status: He is alert and oriented to person, place, and time.    Disposition: Discharge disposition: 01-Home or Self Care   Allergies as of 09/06/2021  Reactions   Phenergan [promethazine]    Felt loopy, fatigue, "tears my head up"         Medication List     TAKE these medications    albuterol 108 (90 Base) MCG/ACT inhaler Commonly known as: VENTOLIN  HFA Inhale 2 puffs into the lungs every 6 (six) hours as needed for wheezing or shortness of breath.   carvedilol 6.25 MG tablet Commonly known as: COREG Take 6.25 mg by mouth 2 (two) times daily.   escitalopram 20 MG tablet Commonly known as: LEXAPRO Take 20 mg by mouth daily.   furosemide 20 MG tablet Commonly known as: LASIX Take 20-40 mg by mouth See admin instructions. Take 40 mg in the morning and 20 mg in the afternoon   gabapentin 300 MG capsule Commonly known as: NEURONTIN Take 300 mg by mouth 3 (three) times daily.   lactulose 10 GM/15ML solution Commonly known as: CHRONULAC Take 30 g by mouth daily as needed for constipation.   levothyroxine 150 MCG tablet Commonly known as: SYNTHROID Take 150 mcg by mouth daily before breakfast.   multivitamin with minerals Tabs tablet Take 1 tablet by mouth daily.   traZODone 100 MG tablet Commonly known as: DESYREL Take 100 mg by mouth at bedtime.        Follow-up Information     [facility] Follow up.   Why: Follow up with Dr. Serafina Royals in 2-4 weeks. A scheduler from our office will call you with a date/time. Please call our office with any questions/concerns prior to your visit. Contact information: [place] [phone]                  Electronically Signed: Theresa Duty, NP 09/06/2021, 9:17 AM   I have spent Greater Than 30 Minutes discharging [place].

## 2021-09-11 ENCOUNTER — Other Ambulatory Visit: Payer: Self-pay | Admitting: Interventional Radiology

## 2021-09-11 DIAGNOSIS — Z95828 Presence of other vascular implants and grafts: Secondary | ICD-10-CM

## 2021-09-19 ENCOUNTER — Other Ambulatory Visit (HOSPITAL_COMMUNITY): Payer: Self-pay | Admitting: Student

## 2021-09-19 ENCOUNTER — Other Ambulatory Visit: Payer: Self-pay | Admitting: Interventional Radiology

## 2021-09-19 DIAGNOSIS — Z95828 Presence of other vascular implants and grafts: Secondary | ICD-10-CM

## 2021-09-20 DIAGNOSIS — R188 Other ascites: Secondary | ICD-10-CM | POA: Diagnosis not present

## 2021-09-24 ENCOUNTER — Ambulatory Visit
Admission: RE | Admit: 2021-09-24 | Discharge: 2021-09-24 | Disposition: A | Discharge status: Home / Self Care | Payer: Medicare Other | Source: Ambulatory Visit | Attending: Interventional Radiology | Admitting: Interventional Radiology

## 2021-09-24 ENCOUNTER — Other Ambulatory Visit: Payer: Self-pay

## 2021-09-24 ENCOUNTER — Encounter: Payer: Self-pay | Admitting: *Deleted

## 2021-09-24 ENCOUNTER — Other Ambulatory Visit: Payer: Self-pay | Admitting: *Deleted

## 2021-09-24 DIAGNOSIS — K766 Portal hypertension: Secondary | ICD-10-CM | POA: Diagnosis not present

## 2021-09-24 DIAGNOSIS — Z95828 Presence of other vascular implants and grafts: Secondary | ICD-10-CM

## 2021-09-24 DIAGNOSIS — K7031 Alcoholic cirrhosis of liver with ascites: Secondary | ICD-10-CM

## 2021-09-24 HISTORY — PX: IR RADIOLOGIST EVAL & MGMT: IMG5224

## 2021-09-24 NOTE — Progress Notes (Signed)
Reason for follow up:  3 weeks status post TIPS   History of present illness: Mr. Raygoza underwent TIPS creation on 09/05/21 at [facility] for history of recurrent ascites in the setting of alcoholic cirrhosis.  The procedure went as planned, with decrease in mean portosystemic gradient from 30 mmHg to 7 mmHg.  The Viatorr stent was dilated to 8 mm.  He had an uncomplicated hospital course, was put on strict lactulose dosing, and discharged home on post-procedure day #1.    He has had a single paracentesis since discharge on 09/20/21 yielding 7 L.  He complains of increased lower extremity edema.  Some foot propping has helped with this.  He continues to take lasix 40 mg in the morning and 20 mg in the afternoon.  He is no longer taking spironolactone.  He has had a good appetite and overall feels well.     Past Medical History:  Diagnosis Date   Anemia    Arthritis    Cancer ([place])    liver   Depression    Diabetes mellitus without complication ([place])    no meds, diet controlled   Hypertension    Sleep apnea    does not use cpap   Substance abuse ([place])    Thyroid disease     Past Surgical History:  Procedure Laterality Date   ANKLE SURGERY     BACK SURGERY     HERNIA REPAIR     IR INTRAVASCULAR ULTRASOUND NON CORONARY  09/05/2021   IR PARACENTESIS  09/05/2021   IR RADIOLOGIST EVAL & MGMT  07/17/2021   IR TIPS  09/05/2021   IR US GUIDE VASC ACCESS RIGHT  09/05/2021   RADIOLOGY WITH ANESTHESIA N/A 09/05/2021   Procedure: RADIOLOGY WITH ANESTHESIA TIPS;  Surgeon: Suzette Battiest, MD;  Location: [place];  Service: Radiology;  Laterality: N/A;    Allergies: Phenergan [promethazine]  Medications: Prior to Admission medications   Medication Sig Start Date End Date Taking? Authorizing Provider  albuterol (VENTOLIN HFA) 108 (90 Base) MCG/ACT inhaler Inhale 2 puffs into the lungs every 6 (six) hours as needed for wheezing or shortness of breath.    [provider]   carvedilol (COREG) 6.25 MG tablet Take 6.25 mg by mouth 2 (two) times daily.    [provider]  escitalopram (LEXAPRO) 20 MG tablet Take 20 mg by mouth daily.    [provider]  furosemide (LASIX) 20 MG tablet Take 20-40 mg by mouth See admin instructions. Take 40 mg in the morning and 20 mg in the afternoon    [provider]  gabapentin (NEURONTIN) 300 MG capsule Take 300 mg by mouth 3 (three) times daily.    [provider]  lactulose (CHRONULAC) 10 GM/15ML solution Take 30 g by mouth daily as needed for constipation. 03/06/21   [provider]  levothyroxine (SYNTHROID) 150 MCG tablet Take 150 mcg by mouth daily before breakfast.    [provider]  Multiple Vitamin (MULTIVITAMIN WITH MINERALS) TABS tablet Take 1 tablet by mouth daily.    [provider]  traZODone (DESYREL) 100 MG tablet Take 100 mg by mouth at bedtime.    [provider]     No family history on file.  Social History   Socioeconomic History   Marital status: Single    Spouse name: Not on file   Number of children: Not on file   Years of education: Not on file   Highest education  level: Not on file  Occupational History   Not on file  Tobacco Use   Smoking status: Every Day    Packs/day: 1.00    Types: Cigarettes   Smokeless tobacco: Never  Vaping Use   Vaping Use: Never used  Substance and Sexual Activity   Alcohol use: Not Currently   Drug use: Yes    Types: Marijuana    Comment: daily   Sexual activity: Not on file  Other Topics Concern   Not on file  Social History Narrative   Not on file   Social Determinants of Health   Financial Resource Strain: Not on file  Food Insecurity: Not on file  Transportation Needs: Not on file  Physical Activity: Not on file  Stress: Not on file  Social Connections: Not on file     Vital Signs: There were no vitals taken for this visit.  No physical examination in lieu of clinic  visit.  Imaging: TIPS 09/05/21  6+2 Viatorr dilated to 8 mm, middle hepatic vein to left portal vein.   Labs:  CBC: Recent Labs    08/06/21 1000 09/05/21 0624 09/06/21 0401  WBC 2.6* 2.6* 5.0  HGB 10.4* 10.2* 9.2*  HCT 33.0* 32.0* 28.3*  PLT PLATELET CLUMPS NOTED ON SMEAR, UNABLE TO ESTIMATE 50* 51*    COAGS: Recent Labs    09/05/21 0624 09/06/21 0401  INR 1.2 1.5*    BMP: Recent Labs    08/06/21 1000 09/05/21 0624 09/06/21 0401  NA 134* 136 139  K 3.9 4.0 3.8  CL 102 103 108  CO2 28 27 28   GLUCOSE 120* 115* 157*  BUN 30* 36* 34*  CALCIUM 8.2* 8.2* 8.4*  CREATININE 1.34* 1.44* 1.47*  GFRNONAA >60 56* 55*    LIVER FUNCTION TESTS: Recent Labs    08/06/21 1000 09/06/21 0401  BILITOT 0.9 0.6  AST 25 24  ALT 22 17  ALKPHOS 88 55  PROT 6.3* 5.2*  ALBUMIN 2.8* 2.4*    Assessment and Plan: 60 year old male with history of alcoholic cirrhosis complicated by portal hypertension and recurrent ascites (MELD 15 on 09/06/21), status post TIPS creation on 09/05/21 with decrease in mean portosystemic gradient from 30 mmHg to 7 mmHg.  Since TIPS creation he has noticed increased lower extremity edema.  Hopefully this is a result of altered fluid dynamics which will resolve with autodiuresis over time, but may require increasing lasix dose and possible resuming spironolactone.  Plan for outpatient labs to include CBC, CMP, and INR.  He states that these are easiest for him to obtain at his PCP office Heide Scales, NP).    Once labs are resulted, will consider increasing diuretics.  Plan to continue paracentesis as needed.  Otherwise plan to follow up in 1 month.   Electronically Signed: Suzette Battiest 09/24/2021, 8:18 AM   I spent a total of 25 Minutes in face to face in clinical consultation, greater than 50% of which was counseling/coordinating care for portal hypertension.

## 2021-09-30 DIAGNOSIS — R188 Other ascites: Secondary | ICD-10-CM | POA: Diagnosis not present

## 2021-09-30 DIAGNOSIS — K7460 Unspecified cirrhosis of liver: Secondary | ICD-10-CM | POA: Diagnosis not present

## 2021-10-02 DIAGNOSIS — K7682 Hepatic encephalopathy: Secondary | ICD-10-CM | POA: Diagnosis not present

## 2021-10-02 DIAGNOSIS — K766 Portal hypertension: Secondary | ICD-10-CM | POA: Diagnosis not present

## 2021-10-02 DIAGNOSIS — C220 Liver cell carcinoma: Secondary | ICD-10-CM | POA: Diagnosis not present

## 2021-10-02 DIAGNOSIS — E8770 Fluid overload, unspecified: Secondary | ICD-10-CM | POA: Diagnosis not present

## 2021-10-16 ENCOUNTER — Ambulatory Visit
Admission: RE | Admit: 2021-10-16 | Discharge: 2021-10-16 | Disposition: A | Discharge status: Home / Self Care | Payer: Medicare Other | Source: Ambulatory Visit | Attending: Interventional Radiology | Admitting: Interventional Radiology

## 2021-10-16 ENCOUNTER — Encounter: Payer: Self-pay | Admitting: *Deleted

## 2021-10-16 ENCOUNTER — Ambulatory Visit
Admission: RE | Admit: 2021-10-16 | Discharge: 2021-10-16 | Disposition: A | Discharge status: Home / Self Care | Payer: Medicare Other | Source: Ambulatory Visit | Attending: Student | Admitting: Student

## 2021-10-16 DIAGNOSIS — Z95828 Presence of other vascular implants and grafts: Secondary | ICD-10-CM

## 2021-10-16 DIAGNOSIS — Z9689 Presence of other specified functional implants: Secondary | ICD-10-CM | POA: Diagnosis not present

## 2021-10-16 DIAGNOSIS — K766 Portal hypertension: Secondary | ICD-10-CM | POA: Diagnosis not present

## 2021-10-16 DIAGNOSIS — K760 Fatty (change of) liver, not elsewhere classified: Secondary | ICD-10-CM | POA: Diagnosis not present

## 2021-10-16 HISTORY — PX: IR RADIOLOGIST EVAL & MGMT: IMG5224

## 2021-10-16 NOTE — Progress Notes (Signed)
Referring Physician(s): [name], PA-C  Reason for follow up: Status post TIPS 09/05/21  History of present illness: David Griffin underwent TIPS creation on 09/05/21 at [facility] for history of recurrent ascites in the setting of alcoholic cirrhosis.  The procedure went as planned, with decrease in mean portosystemic gradient from 30 mmHg to 7 mmHg.  The Viatorr stent was dilated to 8 mm.  He had an uncomplicated hospital course, was put on strict lactulose dosing, and discharged home on post-procedure day #1.  Last visit was on 09/24/21 at which time he had undergone a single paracentesis yielding 7 L on 09/20/21.  Since then he has had one additional paracentesis on 09/30/21, also yielding 7 L.  He has experienced bilateral lower extremity and left upper extremity edema, for which Ms. Moore recently added spironolactone 100 mg QD.    His edema is not significantly changed.  He denies any hepatic encephalopathy.  No shortness of breath or chest pain.  Denies jaundice or scleral icterus.  He has remained compliant with lactulose reporting 5-6 loose stools per day.   Past Medical History:  Diagnosis Date   Anemia    Arthritis    Cancer ([place])    liver   Depression    Diabetes mellitus without complication ([place])    no meds, diet controlled   Hypertension    Sleep apnea    does not use cpap   Substance abuse ([place])    Thyroid disease     Past Surgical History:  Procedure Laterality Date   ANKLE SURGERY     BACK SURGERY     HERNIA REPAIR     IR INTRAVASCULAR ULTRASOUND NON CORONARY  09/05/2021   IR PARACENTESIS  09/05/2021   IR RADIOLOGIST EVAL & MGMT  07/17/2021   IR RADIOLOGIST EVAL & MGMT  09/24/2021   IR TIPS  09/05/2021   IR US GUIDE VASC ACCESS RIGHT  09/05/2021   RADIOLOGY WITH ANESTHESIA N/A 09/05/2021   Procedure: RADIOLOGY WITH ANESTHESIA TIPS;  Surgeon: Suzette Battiest, MD;  Location: [place];  Service: Radiology;  Laterality: N/A;    Allergies: Phenergan  [promethazine]  Medications: Prior to Admission medications   Medication Sig Start Date End Date Taking? Authorizing Provider  albuterol (VENTOLIN HFA) 108 (90 Base) MCG/ACT inhaler Inhale 2 puffs into the lungs every 6 (six) hours as needed for wheezing or shortness of breath.    [provider]  carvedilol (COREG) 6.25 MG tablet Take 6.25 mg by mouth 2 (two) times daily.    [provider]  escitalopram (LEXAPRO) 20 MG tablet Take 20 mg by mouth daily.    [provider]  furosemide (LASIX) 20 MG tablet Take 20-40 mg by mouth See admin instructions. Take 40 mg in the morning and 20 mg in the afternoon    [provider]  gabapentin (NEURONTIN) 300 MG capsule Take 300 mg by mouth 3 (three) times daily.    [provider]  lactulose (CHRONULAC) 10 GM/15ML solution Take 30 g by mouth daily as needed for constipation. 03/06/21   [provider]  levothyroxine (SYNTHROID) 150 MCG tablet Take 150 mcg by mouth daily before breakfast.    [provider]  Multiple Vitamin (MULTIVITAMIN WITH MINERALS) TABS tablet Take 1 tablet by mouth daily.    [provider]  traZODone (DESYREL) 100 MG tablet Take 100 mg by mouth at bedtime.    [provider]     No family history  on file.  Social History   Socioeconomic History   Marital status: Single    Spouse name: Not on file   Number of children: Not on file   Years of education: Not on file   Highest education level: Not on file  Occupational History   Not on file  Tobacco Use   Smoking status: Every Day    Packs/day: 1.00    Types: Cigarettes   Smokeless tobacco: Never  Vaping Use   Vaping Use: Never used  Substance and Sexual Activity   Alcohol use: Not Currently   Drug use: Yes    Types: Marijuana    Comment: daily   Sexual activity: Not on file  Other Topics Concern   Not on file  Social History Narrative   Not on file   Social Determinants of Health    Financial Resource Strain: Not on file  Food Insecurity: Not on file  Transportation Needs: Not on file  Physical Activity: Not on file  Stress: Not on file  Social Connections: Not on file     Vital Signs: There were no vitals taken for this visit.  Physical Exam Constitutional:      General: He is not in acute distress. HENT:     Head: Normocephalic.     Mouth/Throat:     Mouth: Mucous membranes are moist.  Eyes:     General: No scleral icterus. Cardiovascular:     Rate and Rhythm: Normal rate and regular rhythm.  Pulmonary:     Breath sounds: Normal breath sounds.  Abdominal:     General: There is no distension.     Tenderness: There is no abdominal tenderness.  Musculoskeletal:        General: Swelling present.     Right lower leg: Edema present.     Left lower leg: Edema present.  Skin:    General: Skin is warm.     Coloration: Skin is not jaundiced.  Neurological:     Mental Status: He is alert and oriented to person, place, and time.    Imaging: TIPS 09/05/21  6+2 Viatorr dilated to 8 mm, middle hepatic vein to left portal vein.  US Liver (10/16/21) Patent TIPS.  Small volume ascites.  Cirrhosis.  Labs:       Assessment and Plan: 60 year old male with history of alcoholic cirrhosis complicated by portal hypertension and recurrent ascites (MELD 15 on 09/06/21), status post TIPS creation on 09/05/21 with decrease in mean portosystemic gradient from 30 mmHg to 7 mmHg.  Recent addition of spironolactone to diuretic regimen with persistent bilateral lower extremity and left upper extremity edema.    Agree with continuing spironolactone and lasix.  If edema is persistent, consider increasing lasix.  Agree with starting rifaximin if possible.  Maybe able to decrease lactuolse dosing if tolerated well and continues to have no hepatic encephalopathy.  Appreciate excellent care by [name], PA-C.  Plan to continue paracenteses as needed.    Follow up in  IR clinic in 3 months or sooner if needed.    Electronically Signed: Rosanne Ashing Berley Gambrell 10/16/2021, 10:14 AM   I spent a total of 25 Minutes in face to face in clinical consultation, greater than 50% of which was counseling/coordinating care for portal hypertension s/p TIPS.

## 2021-10-22 DIAGNOSIS — C220 Liver cell carcinoma: Secondary | ICD-10-CM | POA: Diagnosis not present

## 2021-10-22 DIAGNOSIS — R140 Abdominal distension (gaseous): Secondary | ICD-10-CM | POA: Diagnosis not present

## 2021-10-22 DIAGNOSIS — K7460 Unspecified cirrhosis of liver: Secondary | ICD-10-CM | POA: Diagnosis not present

## 2021-10-22 DIAGNOSIS — R188 Other ascites: Secondary | ICD-10-CM | POA: Diagnosis not present

## 2021-11-20 DIAGNOSIS — C220 Liver cell carcinoma: Secondary | ICD-10-CM | POA: Diagnosis not present

## 2021-12-05 ENCOUNTER — Other Ambulatory Visit: Payer: Self-pay | Admitting: Interventional Radiology

## 2021-12-05 ENCOUNTER — Telehealth: Payer: Self-pay | Admitting: Student

## 2021-12-05 DIAGNOSIS — K7031 Alcoholic cirrhosis of liver with ascites: Secondary | ICD-10-CM

## 2021-12-05 NOTE — Telephone Encounter (Signed)
The patient's girlfriend called the outpatient clinic with concerns that David Griffin is having some confusion. IR APP asked to return call and I called and spoke with David Griffin who is at work today. He states he is having intermittent confusion/short term memory loss. He will go into a room and forget why is there. He will get into the car and forget where he is going. He says these issues are multiple times a day for many days now. He is taking all medications as prescribed and having 2-3 bowel movements daily. Dr. Serafina Royals was notified of this information with instructions as follows: double daily dose of lactulose, MELD labs need to be drawn and patient needs to have a virtual visit with Dr. Serafina Royals 12/15.   Vickie at the outpatient clinic was notified and she will arrange the tele-visit for 12/15. Outpatient labs will be ordered to be drawn at [facility] and Dr. Serafina Royals will notify the [facility] nurses to place this order. The patient was called back with this information and he is also aware to double the daily dose of lactulose. David Griffin knows to call the clinic with any questions/concerns.  David Griffin,  [phone] 12/05/2021, 11:43 AM

## 2021-12-12 ENCOUNTER — Ambulatory Visit
Admission: RE | Admit: 2021-12-12 | Discharge: 2021-12-12 | Disposition: A | Discharge status: Home / Self Care | Payer: Medicare Other | Source: Ambulatory Visit | Attending: Interventional Radiology | Admitting: Interventional Radiology

## 2021-12-12 ENCOUNTER — Other Ambulatory Visit: Payer: Self-pay

## 2021-12-12 ENCOUNTER — Encounter: Payer: Self-pay | Admitting: *Deleted

## 2021-12-12 DIAGNOSIS — K7031 Alcoholic cirrhosis of liver with ascites: Secondary | ICD-10-CM

## 2021-12-12 DIAGNOSIS — Z9689 Presence of other specified functional implants: Secondary | ICD-10-CM | POA: Diagnosis not present

## 2021-12-12 DIAGNOSIS — K766 Portal hypertension: Secondary | ICD-10-CM | POA: Diagnosis not present

## 2021-12-12 DIAGNOSIS — G9340 Encephalopathy, unspecified: Secondary | ICD-10-CM | POA: Diagnosis not present

## 2021-12-12 HISTORY — PX: IR RADIOLOGIST EVAL & MGMT: IMG5224

## 2021-12-12 NOTE — Progress Notes (Signed)
Referring Physician(s): Mylinh Cragg J  Reason for followup: Encephalopathy after TIPS, telephone virtual visit  History of Present Illness:  60 year old male with history of alcoholic cirrhosis complicated by portal hypertension and recurrent ascites (MELD 15 on 09/06/21), status post TIPS creation on 09/05/21 with decrease in mean portosystemic gradient from 30 mmHg to 7 mmHg.  Our office was notified last week that David Griffin was experiencing intermittent encephalopathy.  We instructed him to double his dose of lactulose, which he has been compliant with, and get outpatient MELD labs.  His labs were unremarkable, with persistent mild renal dysfunction and mild hyperbilirubinemia.  Since doubling his dose, he has had much less frequent encephalopathy which remains mild.  He is still working with his pharmacy, insurance company, and [name] to obtain rifaximin.    He has not had recurrence of ascites.  His peripheral edema has resolved from 2 months ago.  He has a part time job now as a Training and development officer.     Past Medical History:  Diagnosis Date   Anemia    Arthritis    Cancer ([place])    liver   Depression    Diabetes mellitus without complication ([place])    no meds, diet controlled   Hypertension    Sleep apnea    does not use cpap   Substance abuse ([place])    Thyroid disease     Past Surgical History:  Procedure Laterality Date   ANKLE SURGERY     BACK SURGERY     HERNIA REPAIR     IR INTRAVASCULAR ULTRASOUND NON CORONARY  09/05/2021   IR PARACENTESIS  09/05/2021   IR RADIOLOGIST EVAL & MGMT  07/17/2021   IR RADIOLOGIST EVAL & MGMT  09/24/2021   IR RADIOLOGIST EVAL & MGMT  10/16/2021   IR TIPS  09/05/2021   IR US GUIDE VASC ACCESS RIGHT  09/05/2021   RADIOLOGY WITH ANESTHESIA N/A 09/05/2021   Procedure: RADIOLOGY WITH ANESTHESIA TIPS;  Surgeon: Suzette Battiest, MD;  Location: [place];  Service: Radiology;  Laterality: N/A;    Allergies: Phenergan [promethazine]  Medications: Prior to  Admission medications   Medication Sig Start Date End Date Taking? Authorizing Provider  albuterol (VENTOLIN HFA) 108 (90 Base) MCG/ACT inhaler Inhale 2 puffs into the lungs every 6 (six) hours as needed for wheezing or shortness of breath.    [provider]  carvedilol (COREG) 6.25 MG tablet Take 6.25 mg by mouth 2 (two) times daily.    [provider]  escitalopram (LEXAPRO) 20 MG tablet Take 20 mg by mouth daily.    [provider]  furosemide (LASIX) 20 MG tablet Take 20-40 mg by mouth See admin instructions. Take 40 mg in the morning and 20 mg in the afternoon    [provider]  gabapentin (NEURONTIN) 300 MG capsule Take 300 mg by mouth 3 (three) times daily.    [provider]  lactulose (CHRONULAC) 10 GM/15ML solution Take 30 g by mouth daily as needed for constipation. 03/06/21   [provider]  levothyroxine (SYNTHROID) 150 MCG tablet Take 150 mcg by mouth daily before breakfast.    [provider]  Multiple Vitamin (MULTIVITAMIN WITH MINERALS) TABS tablet Take 1 tablet by mouth daily.    [provider]  traZODone (DESYREL) 100 MG tablet Take 100 mg by mouth at bedtime.    [provider]     No family history on file.  Social History   Socioeconomic  History   Marital status: Single    Spouse name: Not on file   Number of children: Not on file   Years of education: Not on file   Highest education level: Not on file  Occupational History   Not on file  Tobacco Use   Smoking status: Every Day    Packs/day: 1.00    Types: Cigarettes   Smokeless tobacco: Never  Vaping Use   Vaping Use: Never used  Substance and Sexual Activity   Alcohol use: Not Currently   Drug use: Yes    Types: Marijuana    Comment: daily   Sexual activity: Not on file  Other Topics Concern   Not on file  Social History Narrative   Not on file   Social Determinants of Health   Financial Resource Strain: Not on file   Food Insecurity: Not on file  Transportation Needs: Not on file  Physical Activity: Not on file  Stress: Not on file  Social Connections: Not on file     Vital Signs: There were no vitals taken for this visit.  No physical examination performed in lieu of telephone clinic visit.  Imaging: No pertinent recent imaging.  Labs: 12/09/21 [facility]): WBC 3.5, Hb 10.8, Hct 31.0, Plt 66 Na 140, K 3.8, Cl 106, CO2 32, BUN 22, Cr 1.3, Glu 143 Albumin 3.0, T Bili 1.8, AST 37, AST 23, Alk Phos 93 INR 1.2   Assessment and Plan: 60 year old male with history of alcoholic cirrhosis complicated by portal hypertension and recurrent ascites (MELD 15 on 09/06/21), status post TIPS creation on 09/05/21 with decrease in mean portosystemic gradient from 30 mmHg to 7 mmHg.  Recent mild encephalopathy better controlled with increased lactulose dosing, still undergoing approval process from insurance for rifaximin.  Improved third spacing since last visit with increase diuretics, no recurrence of ascites.  Follow up in 6 months, no imaging required, or sooner if necessaru.  Electronically Signed: Suzette Battiest 12/12/2021, 1:12 PM   I spent a total of 25 Minutes in telphone clinical consultation, greater than 50% of which was counseling/coordinating care for portal hypertension.

## 2021-12-17 ENCOUNTER — Other Ambulatory Visit: Payer: Self-pay | Admitting: Interventional Radiology

## 2021-12-17 DIAGNOSIS — K7031 Alcoholic cirrhosis of liver with ascites: Secondary | ICD-10-CM

## 2022-02-04 ENCOUNTER — Other Ambulatory Visit (HOSPITAL_COMMUNITY): Payer: Self-pay

## 2022-04-23 ENCOUNTER — Other Ambulatory Visit: Payer: Self-pay | Admitting: Orthopedic Surgery

## 2022-04-23 DIAGNOSIS — M25512 Pain in left shoulder: Secondary | ICD-10-CM

## 2022-04-29 ENCOUNTER — Telehealth: Payer: Self-pay | Admitting: Student

## 2022-04-29 NOTE — Telephone Encounter (Signed)
Patient with history of routine paracenteses and TIPS at [facility] 09/05/21. I called the patient today to check in on him and he states he is doing well, feeling well and he can't remember the last time he needed a paracentesis. He states he's only had 2 since TIPS. He is typically seen at [facility] and [place] shows he is seeing doctors at [facility]. He is currently experiencing shoulder pain and states he will be getting surgery soon. Patient is due to follow up with IR in June 2023. He denies having any current symptoms/issues/concerns related to TIPS/Cirrhosis. He knows he can call IR anytime with questions or concerns. ? David Griffin, AGACNP-BC ?[phone] ?04/29/2022, 10:40 AM ? ? ?

## 2022-05-01 ENCOUNTER — Other Ambulatory Visit: Payer: Self-pay | Admitting: Interventional Radiology

## 2022-05-01 DIAGNOSIS — K7031 Alcoholic cirrhosis of liver with ascites: Secondary | ICD-10-CM

## 2022-05-07 ENCOUNTER — Other Ambulatory Visit: Payer: Medicare Other

## 2022-05-07 ENCOUNTER — Ambulatory Visit
Admission: RE | Admit: 2022-05-07 | Discharge: 2022-05-07 | Disposition: A | Discharge status: Home / Self Care | Payer: Medicare Other | Source: Ambulatory Visit | Attending: Orthopedic Surgery | Admitting: Orthopedic Surgery

## 2022-05-07 DIAGNOSIS — M25512 Pain in left shoulder: Secondary | ICD-10-CM

## 2022-06-12 ENCOUNTER — Encounter: Payer: Self-pay | Admitting: *Deleted

## 2022-06-12 ENCOUNTER — Ambulatory Visit
Admission: RE | Admit: 2022-06-12 | Discharge: 2022-06-12 | Disposition: A | Discharge status: Home / Self Care | Payer: Medicare Other | Source: Ambulatory Visit | Attending: Interventional Radiology | Admitting: Interventional Radiology

## 2022-06-12 DIAGNOSIS — K7031 Alcoholic cirrhosis of liver with ascites: Secondary | ICD-10-CM

## 2022-06-12 HISTORY — PX: IR RADIOLOGIST EVAL & MGMT: IMG5224

## 2022-06-12 NOTE — Progress Notes (Addendum)
Referring Physician(s): [name], PA-C  Reason for follow up:  Interval 6 month follow up, s/p TIPS   History of present illness: 61 year old male with history of alcoholic cirrhosis complicated by portal hypertension and recurrent ascites (MELD 15 on 09/06/21), status post TIPS creation on 09/05/21 with decrease in mean portosystemic gradient from 30 mmHg to 7 mmHg.  He states he has been doing well.  No further significant hepatic encephalopathy.  Taking lactulose every other day.  Continues on rifaximin daily.  No recurrence of ascites, but states he has gained weight but is uncertain why.  He endorses cramping in his hands which he is currently taking gabapentin for.  No jaundice.  He is still working as a Training and development officer.  Continues to follow up with [name], PA-C, last visit on 06/04/22.   Past Medical History:  Diagnosis Date   Anemia    Arthritis    Cancer ([place])    liver   Depression    Diabetes mellitus without complication ([place])    no meds, diet controlled   Hypertension    Sleep apnea    does not use cpap   Substance abuse ([place])    Thyroid disease     Past Surgical History:  Procedure Laterality Date   ANKLE SURGERY     BACK SURGERY     HERNIA REPAIR     IR INTRAVASCULAR ULTRASOUND NON CORONARY  09/05/2021   IR PARACENTESIS  09/05/2021   IR RADIOLOGIST EVAL & MGMT  07/17/2021   IR RADIOLOGIST EVAL & MGMT  09/24/2021   IR RADIOLOGIST EVAL & MGMT  10/16/2021   IR RADIOLOGIST EVAL & MGMT  12/12/2021   IR TIPS  09/05/2021   IR US GUIDE VASC ACCESS RIGHT  09/05/2021   RADIOLOGY WITH ANESTHESIA N/A 09/05/2021   Procedure: RADIOLOGY WITH ANESTHESIA TIPS;  Surgeon: Suzette Battiest, MD;  Location: [place];  Service: Radiology;  Laterality: N/A;    Allergies: Phenergan [promethazine]  Medications: Prior to Admission medications   Medication Sig Start Date End Date Taking? Authorizing Provider  albuterol (VENTOLIN HFA) 108 (90 Base) MCG/ACT inhaler Inhale 2 puffs into the  lungs every 6 (six) hours as needed for wheezing or shortness of breath.    [provider]  carvedilol (COREG) 6.25 MG tablet Take 6.25 mg by mouth 2 (two) times daily.    [provider]  escitalopram (LEXAPRO) 20 MG tablet Take 20 mg by mouth daily.    [provider]  furosemide (LASIX) 20 MG tablet Take 20-40 mg by mouth See admin instructions. Take 40 mg in the morning and 20 mg in the afternoon    [provider]  gabapentin (NEURONTIN) 300 MG capsule Take 300 mg by mouth 3 (three) times daily.    [provider]  lactulose (CHRONULAC) 10 GM/15ML solution Take 30 g by mouth daily as needed for constipation. 03/06/21   [provider]  levothyroxine (SYNTHROID) 150 MCG tablet Take 150 mcg by mouth daily before breakfast.    [provider]  Multiple Vitamin (MULTIVITAMIN WITH MINERALS) TABS tablet Take 1 tablet by mouth daily.    [provider]  traZODone (DESYREL) 100 MG tablet Take 100 mg by mouth at bedtime.    [provider]     No family history on file.  Social History   Socioeconomic History   Marital status: Single    Spouse name: Not on file   Number of children: Not  on file   Years of education: Not on file   Highest education level: Not on file  Occupational History   Not on file  Tobacco Use   Smoking status: Every Day    Packs/day: 1.00    Types: Cigarettes   Smokeless tobacco: Never  Vaping Use   Vaping Use: Never used  Substance and Sexual Activity   Alcohol use: Not Currently   Drug use: Yes    Types: Marijuana    Comment: daily   Sexual activity: Not on file  Other Topics Concern   Not on file  Social History Narrative   Not on file   Social Determinants of Health   Financial Resource Strain: Not on file  Food Insecurity: Not on file  Transportation Needs: Not on file  Physical Activity: Not on file  Stress: Not on file  Social Connections: Not on file     Vital  Signs: There were no vitals taken for this visit.  No physical examination was performed in lieu of virtual telephone clinic visit.   Imaging: No results found.  Labs: 12/09/21 [facility]): WBC 3.5, Hb 10.8, Hct 31.0, Plt 66 Na 140, K 3.8, Cl 106, CO2 32, BUN 22, Cr 1.3, Glu 143 Albumin 3.0, T Bili 1.8, AST 37, AST 23, Alk Phos 93 INR 1.2     Assessment and Plan: 61 year old male with history of alcoholic cirrhosis complicated by portal hypertension and recurrent ascites (MELD 13 on 06/04/22), status post TIPS creation on 09/05/21 with decrease in mean portosystemic gradient from 30 mmHg to 7 mmHg.  Recovering well with no recurrent ascites, well controlled encephalopathy.  Mild hyperbilirubinemia on recent labs of uncertain etiology.   Follow up in 6 months, no imaging required, or sooner if necessary.  Ruthann Cancer, MD Pager: [phone] Clinic: [phone]    I spent a total of 25 Minutes in virtual telephone clinical consultation, greater than 50% of which was counseling/coordinating care for portal hypertension.

## 2022-09-30 NOTE — Progress Notes (Addendum)
Anesthesia Review: PCP- David Griffin at [facility] on chart dated 8/24 23 states must be cleared by transplant department    Hematology/Oncology : : David Griffin 08/26/22- Last visit -   - labs in note .  On chart  Gastro- LOV with David Griffin on  09/03/22  Cardiologist : none  Called and LVMM and requested clearances. From [place] at office.    Chest x-ray : EKG : 10/03/22  Echo : Stress test: Cardiac Cath :  Activity level:  can do a flight of stairs without difficutly  Sleep Study/ CPAP : none  Fasting Blood Sugar :      / Checks Blood Sugar -- times a day:   Blood Thinner/ Instructions /Last Dose: ASA / Instructions/ Last Dose :   CBC and BMp  done 10/6//23 rouited to DR Supple David Griffin made aware platelet count of 89 at preop  and Creatinine on 1.9 on 10/03/22 and routed to DR Supple.  No new orders given.   ? Future paracentesis,  Hx of paracentesis in past.   Heart rate at preop - 50 .  pT voices no complaints.  PT states that is his heart rate.

## 2022-09-30 NOTE — Patient Instructions (Addendum)
SURGICAL WAITING ROOM VISITATION Patients having surgery or a procedure may have no more than 2 support people in the waiting area - these visitors may rotate.   Children under the age of 41 must have an adult with them who is not the patient. If the patient needs to stay at the hospital during part of their recovery, the visitor guidelines for inpatient rooms apply. Pre-op nurse will coordinate an appropriate time for 1 support person to accompany patient in pre-op.  This support person may not rotate.    Please refer to the [facility] website for the visitor guidelines for Inpatients (after your surgery is over and you are in a regular room).       Your procedure is scheduled on:  10/16/2022    Report to [facility] Main Entrance    Report to admitting at   [phone]   Call this number if you have problems the morning of surgery [phone]   Do not eat food :After Midnight.    After Midnight you may have the following liquids until __0430____ AM DAY OF SURGERY  Water Non-Citrus Juices (without pulp, NO RED) Carbonated Beverages Black Coffee (NO MILK/CREAM OR CREAMERS, sugar ok)  Clear Tea (NO MILK/CREAM OR CREAMERS, sugar ok) regular and decaf                             Plain Jell-O (NO RED)                                           Fruit ices (not with fruit pulp, NO RED)                                     Popsicles (NO RED)                                                               Sports drinks like Gatorade (NO RED)                 The day of surgery:  Drink ONE (1) Pre-Surgery Clear Ensure or G2 at  0430AM  ( have completed by ) the morning of surgery. Drink in one sitting. Do not sip.  This drink was given to you during your hospital  pre-op appointment visit. Nothing else to drink after completing the  Pre-Surgery Clear Ensure or G2.          If you have questions, please contact your surgeon's office.     Oral Hygiene is also important to reduce your  risk of infection.                                    Remember - BRUSH YOUR TEETH THE MORNING OF SURGERY WITH YOUR REGULAR TOOTHPASTE   Do NOT smoke after Midnight   Take these medicines the morning of surgery with A SIP OF WATER:  synthroid, inhalers as usual and bring, coreg, lexapro, gabapentin   DO  NOT TAKE ANY ORAL DIABETIC MEDICATIONS DAY OF YOUR SURGERY  Bring CPAP mask and tubing day of surgery.                              You may not have any metal on your body including hair pins, jewelry, and body piercing             Do not wear make-up, lotions, powders, perfumes/cologne, or deodorant  Do not wear nail polish including gel and S&S, artificial/acrylic nails, or any other type of covering on natural nails including finger and toenails. If you have artificial nails, gel coating, etc. that needs to be removed by a nail salon please have this removed prior to surgery or surgery may need to be canceled/ delayed if the surgeon/ anesthesia feels like they are unable to be safely monitored.   Do not shave  48 hours prior to surgery.               Men may shave face and neck.   Do not bring valuables to the hospital. [place].   Contacts, dentures or bridgework may not be worn into surgery.   Bring small overnight bag day of surgery.   DO NOT [place]. PHARMACY WILL DISPENSE MEDICATIONS LISTED ON YOUR MEDICATION LIST TO YOU DURING YOUR ADMISSION [place]!    Patients discharged on the day of surgery will not be allowed to drive home.  Someone NEEDS to stay with you for the first 24 hours after anesthesia.   Special Instructions: Bring a copy of your healthcare power of attorney and living will documents the day of surgery if you haven't scanned them before.              Please read over the following fact sheets you were given: IF [place] [phone]   If you received a COVID test during your pre-op visit  it is requested that you wear a mask when out in public, stay away from anyone that may not be feeling well and notify your surgeon if you develop symptoms. If you test positive for Covid or have been in contact with anyone that has tested positive in the last 10 days please notify you surgeon.      - Preparing for Surgery Before surgery, you can play an important role.  Because skin is not sterile, your skin needs to be as free of germs as possible.  You can reduce the number of germs on your skin by washing with CHG (chlorahexidine gluconate) soap before surgery.  CHG is an antiseptic cleaner which kills germs and bonds with the skin to continue killing germs even after washing. Please DO NOT use if you have an allergy to CHG or antibacterial soaps.  If your skin becomes reddened/irritated stop using the CHG and inform your nurse when you arrive at Short Stay. Do not shave (including legs and underarms) for at least 48 hours prior to the first CHG shower.  You may shave your face/neck. Please follow these instructions carefully:  1.  Shower with CHG Soap the night before surgery and the  morning of Surgery.  2.  If you choose to wash your hair, wash your hair first as usual with  your  normal  shampoo.  3.  After you shampoo, rinse your hair and body thoroughly to remove the  shampoo.                           4.  Use CHG as you would any other liquid soap.  You can apply chg directly  to the skin and wash                       Gently with a scrungie or clean washcloth.  5.  Apply the CHG Soap to your body ONLY FROM THE NECK DOWN.   Do not use on face/ open                           Wound or open sores. Avoid contact with eyes, ears mouth and genitals (private parts).                       Wash face,  Genitals (private parts) with your normal soap.             6.  Wash thoroughly, paying special attention to the  area where your surgery  will be performed.  7.  Thoroughly rinse your body with warm water from the neck down.  8.  DO NOT shower/wash with your normal soap after using and rinsing off  the CHG Soap.                9.  Pat yourself dry with a clean towel.            10.  Wear clean pajamas.            11.  Place clean sheets on your bed the night of your first shower and do not  sleep with pets. Day of Surgery : Do not apply any lotions/deodorants the morning of surgery.  Please wear clean clothes to the hospital/surgery center.  FAILURE TO FOLLOW THESE INSTRUCTIONS MAY RESULT IN THE CANCELLATION OF YOUR SURGERY PATIENT SIGNATURE_________________________________  NURSE SIGNATURE__________________________________  ________________________________________________________________________ [facility]- Preparing for Total Shoulder Arthroplasty    Before surgery, you can play an important role. Because skin is not sterile, your skin needs to be as free of germs as possible. You can reduce the number of germs on your skin by using the following products. Benzoyl Peroxide Gel Reduces the number of germs present on the skin Applied twice a day to shoulder area starting two days before surgery    ==================================================================  Please follow these instructions carefully:  BENZOYL PEROXIDE 5% GEL  Please do not use if you have an allergy to benzoyl peroxide.   If your skin becomes reddened/irritated stop using the benzoyl peroxide.  Starting two days before surgery, apply as follows: Apply benzoyl peroxide in the morning and at night. Apply after taking a shower. If you are not taking a shower clean entire shoulder front, back, and side along with the armpit with a clean wet washcloth.  Place a quarter-sized dollop on your shoulder and rub in thoroughly, making sure to cover the front, back, and side of your shoulder, along with the armpit.   2 days before  ____ AM   ____ PM              1 day before ____ AM   ____ PM  Do this twice a day for two days.  (Last application is the night before surgery, AFTER using the CHG soap as described below).  Do NOT apply benzoyl peroxide gel on the day of surgery.

## 2022-10-03 ENCOUNTER — Encounter (HOSPITAL_COMMUNITY)
Admission: RE | Admit: 2022-10-03 | Discharge: 2022-10-03 | Disposition: A | Discharge status: Home / Self Care | Payer: Medicare Other | Source: Ambulatory Visit | Attending: Orthopedic Surgery | Admitting: Orthopedic Surgery

## 2022-10-03 ENCOUNTER — Other Ambulatory Visit: Payer: Self-pay

## 2022-10-03 ENCOUNTER — Encounter (HOSPITAL_COMMUNITY): Payer: Self-pay

## 2022-10-03 VITALS — BP 128/68 | HR 50 | Temp 98.7°F | Resp 16 | Ht 75.0 in | Wt 252.0 lb

## 2022-10-03 DIAGNOSIS — M75102 Unspecified rotator cuff tear or rupture of left shoulder, not specified as traumatic: Secondary | ICD-10-CM | POA: Insufficient documentation

## 2022-10-03 DIAGNOSIS — Z01818 Encounter for other preprocedural examination: Secondary | ICD-10-CM | POA: Diagnosis present

## 2022-10-03 HISTORY — DX: Inflammatory liver disease, unspecified: K75.9

## 2022-10-03 HISTORY — DX: Unspecified cirrhosis of liver: K74.60

## 2022-10-03 HISTORY — DX: Anxiety disorder, unspecified: F41.9

## 2022-10-03 HISTORY — DX: Hypothyroidism, unspecified: E03.9

## 2022-10-03 HISTORY — DX: Prediabetes: R73.03

## 2022-10-03 HISTORY — DX: Nontraumatic intracerebral hemorrhage, unspecified: I61.9

## 2022-10-03 LAB — CBC
HCT: 32.8 % — ABNORMAL LOW (ref 39.0–52.0)
Hemoglobin: 11.1 g/dL — ABNORMAL LOW (ref 13.0–17.0)
MCH: 31.4 pg (ref 26.0–34.0)
MCHC: 33.8 g/dL (ref 30.0–36.0)
MCV: 92.9 fL (ref 80.0–100.0)
Platelets: 89 10*3/uL — ABNORMAL LOW (ref 150–400)
RBC: 3.53 MIL/uL — ABNORMAL LOW (ref 4.22–5.81)
RDW: 15.4 % (ref 11.5–15.5)
WBC: 5.3 10*3/uL (ref 4.0–10.5)
nRBC: 0 % (ref 0.0–0.2)

## 2022-10-03 LAB — HEMOGLOBIN A1C
Hgb A1c MFr Bld: 4.9 % (ref 4.8–5.6)
Mean Plasma Glucose: 93.93 mg/dL

## 2022-10-03 LAB — BASIC METABOLIC PANEL
Anion gap: 8 (ref 5–15)
BUN: 35 mg/dL — ABNORMAL HIGH (ref 6–20)
CO2: 28 mmol/L (ref 22–32)
Calcium: 8.9 mg/dL (ref 8.9–10.3)
Chloride: 102 mmol/L (ref 98–111)
Creatinine, Ser: 1.9 mg/dL — ABNORMAL HIGH (ref 0.61–1.24)
GFR, Estimated: 40 mL/min — ABNORMAL LOW (ref >60)
Glucose, Bld: 145 mg/dL — ABNORMAL HIGH (ref 70–99)
Potassium: 3.6 mmol/L (ref 3.5–5.1)
Sodium: 138 mmol/L (ref 135–145)

## 2022-10-03 LAB — SURGICAL PCR SCREEN
MRSA, PCR: NEGATIVE
Staphylococcus aureus: NEGATIVE

## 2022-10-07 NOTE — Progress Notes (Signed)
Anesthesia Chart Review   Case: [id number] Date/Time: 10/16/22 0715   Procedure: REVERSE SHOULDER ARTHROPLASTY (Left: Shoulder) - 155mn   Anesthesia type: General   Pre-op diagnosis: Left shoulder rotator cuff tear arthropathy   [facility]   Surgeons: SJustice Britain MD       DISCUSSION:61 y.o. 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 KJustice Britain   Last seen by GI 09/03/2022. Per OV note volume status improved since TIPS placement, but no resolved. Persistent lower extremity edema.  Per OV note, "Pre-surgical risk assessment: He [facility] We discussed today that surgery in the setting of decompensated cirrhosis always carries risk. His risk factors would include worsened liver function following surgery, new/exacerbated decompensations, particularly volume overload in his case, and bleeding risk related to thrombocytopenia. His latest platelet count was 72k. He is s/p TIPS with volume status optimized on current diuretic regimen. He has not required a paracentesis since 09/2021. His latest MELD score is 15 and we discussed the need for close monitoring of this following surgery. Based on his labs from today's visit, I have included his postoperative mortality risk scores below.  Predicted Postoperative Outcomes by the VOCAL-Penn Score:  30-day mortality: 7.1%  90-day mortality: 11.2%  180-day mortality: 14.6%  90-day decompensation: 14.8%"  Anticipate pt can proceed with planned procedure barring acute status change and after evaluation DOS.  VS: BP 128/68   Pulse (!) 50   Temp 37.1 C (Oral)   Resp 16   Ht '6\' 3"'$  (1.905 m)   Wt 114.3 kg   SpO2 98%   BMI 31.50 kg/m   PROVIDERS: HRhea Bleacher NP   LABS: Labs reviewed: Acceptable  for surgery. (all labs ordered are listed, but only abnormal results are displayed)  Labs Reviewed  BASIC METABOLIC PANEL - Abnormal; Notable for the following components:      Result Value   Glucose, Bld 145 (*)    BUN 35 (*)    Creatinine, Ser 1.90 (*)    GFR, Estimated 40 (*)    All other components within normal limits  CBC - Abnormal; Notable for the following components:   RBC 3.53 (*)    Hemoglobin 11.1 (*)    HCT 32.8 (*)    Platelets 89 (*)    All other components within normal limits  SURGICAL PCR SCREEN  HEMOGLOBIN A1C     IMAGES:   EKG:   CV: Echo 04/04/2021 SUMMARY  TDS - Uncooperative patient.  The left ventricular size is normal.  Left ventricular systolic function is normal.  LV ejection fraction = 60-65%.  The right ventricle is normal in size and function.  The left atrium is mildly dilated.  Right atrium not well visualized.  There was insufficient TR detected to calculate RV systolic pressure.  There is no pericardial effusion.  There is no comparison study available.   Past Medical History:  Diagnosis Date   Anemia    Anxiety    Arthritis    [place]    liver- radiation treatment   [place]bral brain hemorrhage ([place]    2020 per pt   Cir[place]osis of liver ([place]    Depression    Hepatitis    Hypothyroidism    Pre-diabetes    [place]stance [place] ([place]    Thyroid  disease     Past Surgical History:  Procedure Laterality Date   ANKLE SURGERY     x 2   BACK SURGERY     HERNIA REPAIR     x 3   IR INTRAVASCULAR ULTRASOUND NON CORONARY  09/05/2021   IR PARACENTESIS  09/05/2021   IR RADIOLOGIST EVAL & MGMT  07/17/2021   IR RADIOLOGIST EVAL & MGMT  09/24/2021   IR RADIOLOGIST EVAL & MGMT  10/16/2021   IR RADIOLOGIST EVAL & MGMT  12/12/2021   IR RADIOLOGIST EVAL & MGMT  06/12/2022   IR TIPS  09/05/2021   IR US GUIDE VASC ACCESS RIGHT  09/05/2021   RADIOLOGY WITH ANESTHESIA N/A 09/05/2021   Procedure: RADIOLOGY WITH ANESTHESIA TIPS;  Surgeon:  Suzette Battiest, MD;  Location: [place];  Service: Radiology;  Laterality: N/A;    MEDICATIONS:  albuterol (VENTOLIN HFA) 108 (90 Base) MCG/ACT inhaler   baclofen (LIORESAL) 10 MG tablet   carvedilol (COREG) 6.25 MG tablet   escitalopram (LEXAPRO) 20 MG tablet   furosemide (LASIX) 20 MG tablet   lactulose (CHRONULAC) 10 GM/15ML solution   levothyroxine (SYNTHROID) 125 MCG tablet   Multiple Vitamin (MULTIVITAMIN WITH MINERALS) TABS tablet   ondansetron (ZOFRAN) 4 MG tablet   ondansetron (ZOFRAN-ODT) 4 MG disintegrating tablet   spironolactone (ALDACTONE) 100 MG tablet   traZODone (DESYREL) 100 MG tablet   XIFAXAN 550 MG TABS tablet   No current facility-administered medications for this encounter.   Konrad Felix Ward, PA-C [facility] Pre-Surgical Testing [phone]

## 2022-10-07 NOTE — Anesthesia Preprocedure Evaluation (Addendum)
Anesthesia Evaluation  Patient identified by MRN, date of birth, ID band Patient awake    Reviewed: Allergy & Precautions, H&P , NPO status , Patient's Chart, lab work & pertinent test results  Airway Mallampati: I  TM Distance: >3 FB Neck ROM: Full    Dental no notable dental hx. (+) Teeth Intact, Dental Advisory Given, Poor Dentition,    Pulmonary neg pulmonary ROS, Current Smoker,    Pulmonary exam normal breath sounds clear to auscultation       Cardiovascular Exercise Tolerance: Good negative cardio ROS Normal cardiovascular exam Rhythm:Regular Rate:Normal  Echo 04/04/2021 SUMMARY  TDS - Uncooperative patient.  The left ventricular size is normal.  Left ventricular systolic function is normal.  LV ejection fraction = 60-65%.  The right ventricle is normal in size and function.  The left atrium is mildly dilated.  Right atrium not well visualized.  There was insufficient TR detected to calculate RV systolic pressure.  There is no pericardial effusion.  There is no comparison study available.    Neuro/Psych negative neurological ROS  negative psych ROS   GI/Hepatic negative GI ROS, Neg liver ROS, (+)     substance abuse  alcohol use, Hepatitis -, C, Toxin Related  Endo/Other  negative endocrine ROSHypothyroidism   Renal/GU negative Renal ROS  negative genitourinary   Musculoskeletal negative musculoskeletal ROS (+) Arthritis ,   Abdominal   Peds negative pediatric ROS (+)  Hematology negative hematology ROS (+) Blood dyscrasia, anemia ,   Anesthesia Other Findings   Reproductive/Obstetrics negative OB ROS                           Anesthesia Physical Anesthesia Plan  ASA: 4  Anesthesia Plan: General and Regional   Post-op Pain Management: Regional block*, Dilaudid IV and Ketamine IV*   Induction: Intravenous  PONV Risk Score and Plan: Ondansetron, Dexamethasone, Treatment  may vary due to age or medical condition and Midazolam  Airway Management Planned: Oral ETT  Additional Equipment: None  Intra-op Plan:   Post-operative Plan: Extubation in OR  Informed Consent: I have reviewed the patients History and Physical, chart, labs and discussed the procedure including the risks, benefits and alternatives for the proposed anesthesia with the patient or authorized representative who has indicated his/her understanding and acceptance.       Plan Discussed with: Anesthesiologist and CRNA  Anesthesia Plan Comments: (See PAT note 10/03/2022  DISCUSSION:61 y.o. 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 Justice Britain.   Last seen by GI 09/03/2022. Per OV note volume status improved since TIPS placement, but no resolved. Persistent lower extremity edema.  Per OV note, "Pre-surgical risk assessment: He has planned upcoming shoulder replacement surgery with Dr. Justice Britain with [facility]. We discussed today that surgery in the setting of decompensated cirrhosis always carries risk. His risk factors would include worsened liver function following surgery, new/exacerbated decompensations, particularly volume overload in his case, and bleeding risk related to thrombocytopenia. His latest platelet count was 72k. He is s/p TIPS with volume status optimized on current diuretic regimen. He has not required a paracentesis since 09/2021. His latest MELD score is 15 and we discussed the need for close monitoring of this following surgery. Based on his labs from today's visit, I have included his postoperative mortality risk scores below.  Predicted Postoperative Outcomes by  the VOCAL-Penn Score:  30-day mortality: 7.1%  90-day mortality: 11.2%  180-day mortality: 14.6%  90-day decompensation: 14.8%"  Anticipate pt can proceed  with planned procedure barring acute status change and after evaluation DOS. )       Anesthesia Quick Evaluation

## 2022-10-16 ENCOUNTER — Encounter (HOSPITAL_COMMUNITY): Payer: Self-pay | Admitting: Orthopedic Surgery

## 2022-10-16 ENCOUNTER — Ambulatory Visit (HOSPITAL_COMMUNITY)
Admission: RE | Admit: 2022-10-16 | Discharge: 2022-10-16 | Disposition: A | Discharge status: Home / Self Care | Payer: Medicare Other | Source: Ambulatory Visit | Attending: Orthopedic Surgery | Admitting: Orthopedic Surgery

## 2022-10-16 ENCOUNTER — Other Ambulatory Visit: Payer: Self-pay

## 2022-10-16 ENCOUNTER — Ambulatory Visit (HOSPITAL_BASED_OUTPATIENT_CLINIC_OR_DEPARTMENT_OTHER): Payer: Medicare Other | Admitting: Certified Registered Nurse Anesthetist

## 2022-10-16 ENCOUNTER — Ambulatory Visit (HOSPITAL_COMMUNITY): Payer: Medicare Other | Admitting: Physician Assistant

## 2022-10-16 ENCOUNTER — Encounter (HOSPITAL_COMMUNITY)
Admission: RE | Disposition: A | Discharge status: Home / Self Care | Payer: Self-pay | Source: Ambulatory Visit | Attending: Orthopedic Surgery

## 2022-10-16 DIAGNOSIS — M12812 Other specific arthropathies, not elsewhere classified, left shoulder: Secondary | ICD-10-CM

## 2022-10-16 DIAGNOSIS — Z8619 Personal history of other infectious and parasitic diseases: Secondary | ICD-10-CM | POA: Insufficient documentation

## 2022-10-16 DIAGNOSIS — Z01818 Encounter for other preprocedural examination: Secondary | ICD-10-CM

## 2022-10-16 DIAGNOSIS — E039 Hypothyroidism, unspecified: Secondary | ICD-10-CM | POA: Insufficient documentation

## 2022-10-16 DIAGNOSIS — F17210 Nicotine dependence, cigarettes, uncomplicated: Secondary | ICD-10-CM | POA: Insufficient documentation

## 2022-10-16 DIAGNOSIS — M1990 Unspecified osteoarthritis, unspecified site: Secondary | ICD-10-CM | POA: Diagnosis not present

## 2022-10-16 DIAGNOSIS — M75102 Unspecified rotator cuff tear or rupture of left shoulder, not specified as traumatic: Secondary | ICD-10-CM | POA: Diagnosis present

## 2022-10-16 DIAGNOSIS — D638 Anemia in other chronic diseases classified elsewhere: Secondary | ICD-10-CM

## 2022-10-16 HISTORY — PX: REVERSE SHOULDER ARTHROPLASTY: SHX5054

## 2022-10-16 LAB — HEMOGLOBIN AND HEMATOCRIT, BLOOD
HCT: 32.4 % — ABNORMAL LOW (ref 39.0–52.0)
Hemoglobin: 11 g/dL — ABNORMAL LOW (ref 13.0–17.0)

## 2022-10-16 SURGERY — ARTHROPLASTY, SHOULDER, TOTAL, REVERSE
Anesthesia: Regional | Site: Shoulder | Laterality: Left

## 2022-10-16 MED ORDER — KETAMINE HCL 50 MG/5ML IJ SOSY
PREFILLED_SYRINGE | INTRAMUSCULAR | Status: AC
  Filled 2022-10-16: qty 5

## 2022-10-16 MED ORDER — VANCOMYCIN HCL 1000 MG IV SOLR
INTRAVENOUS | Status: AC
  Filled 2022-10-16: qty 20

## 2022-10-16 MED ORDER — MIDAZOLAM HCL 5 MG/5ML IJ SOLN
INTRAMUSCULAR | Status: DC | PRN
  Administered 2022-10-16: 2 mg via INTRAVENOUS

## 2022-10-16 MED ORDER — VANCOMYCIN HCL 1000 MG IV SOLR
INTRAVENOUS | Status: DC | PRN
  Administered 2022-10-16: 1000 mg via TOPICAL

## 2022-10-16 MED ORDER — PROPOFOL 10 MG/ML IV BOLUS
INTRAVENOUS | Status: AC
  Filled 2022-10-16: qty 20

## 2022-10-16 MED ORDER — TRANEXAMIC ACID 1000 MG/10ML IV SOLN: 1000.0000 mg | INTRAVENOUS | Status: DC

## 2022-10-16 MED ORDER — ONDANSETRON HCL 4 MG/2ML IJ SOLN
INTRAMUSCULAR | Status: AC
  Filled 2022-10-16: qty 2

## 2022-10-16 MED ORDER — EPHEDRINE 5 MG/ML INJ
INTRAVENOUS | Status: AC
  Filled 2022-10-16: qty 5

## 2022-10-16 MED ORDER — PROPOFOL 10 MG/ML IV BOLUS
INTRAVENOUS | Status: DC | PRN
  Administered 2022-10-16: 120 mg via INTRAVENOUS
  Administered 2022-10-16: 20 mg via INTRAVENOUS

## 2022-10-16 MED ORDER — ROCURONIUM BROMIDE 10 MG/ML (PF) SYRINGE
PREFILLED_SYRINGE | INTRAVENOUS | Status: DC | PRN
  Administered 2022-10-16: 100 mg via INTRAVENOUS
  Administered 2022-10-16: 20 mg via INTRAVENOUS

## 2022-10-16 MED ORDER — STERILE WATER FOR IRRIGATION IR SOLN
Status: DC | PRN
  Administered 2022-10-16: 1000 mL

## 2022-10-16 MED ORDER — ORAL CARE MOUTH RINSE: 15.0000 mL | Freq: Once | OROMUCOSAL | Status: AC

## 2022-10-16 MED ORDER — TRANEXAMIC ACID 1000 MG/10ML IV SOLN
2000.0000 mg | Freq: Once | INTRAVENOUS | Status: AC
  Administered 2022-10-16: 2000 mg via TOPICAL
  Filled 2022-10-16: qty 20

## 2022-10-16 MED ORDER — ONDANSETRON HCL 4 MG PO TABS: 4.0000 mg | ORAL_TABLET | Freq: Three times a day (TID) | ORAL | 0 refills | Status: DC | PRN

## 2022-10-16 MED ORDER — LACTATED RINGERS IV BOLUS: 250.0000 mL | Freq: Once | INTRAVENOUS | Status: DC

## 2022-10-16 MED ORDER — DEXAMETHASONE SODIUM PHOSPHATE 10 MG/ML IJ SOLN
INTRAMUSCULAR | Status: DC | PRN
  Administered 2022-10-16: 10 mg via INTRAVENOUS

## 2022-10-16 MED ORDER — ROCURONIUM BROMIDE 10 MG/ML (PF) SYRINGE
PREFILLED_SYRINGE | INTRAVENOUS | Status: AC
  Filled 2022-10-16: qty 10

## 2022-10-16 MED ORDER — BUPIVACAINE HCL (PF) 0.5 % IJ SOLN
INTRAMUSCULAR | Status: DC | PRN
  Administered 2022-10-16: 15 mL

## 2022-10-16 MED ORDER — SUGAMMADEX SODIUM 200 MG/2ML IV SOLN
INTRAVENOUS | Status: DC | PRN
  Administered 2022-10-16: 200 mg via INTRAVENOUS

## 2022-10-16 MED ORDER — MEPERIDINE HCL 50 MG/ML IJ SOLN: 6.2500 mg | INTRAMUSCULAR | Status: DC | PRN

## 2022-10-16 MED ORDER — OXYCODONE HCL 5 MG PO TABS: 5.0000 mg | ORAL_TABLET | Freq: Once | ORAL | Status: DC | PRN

## 2022-10-16 MED ORDER — MIDAZOLAM HCL 2 MG/2ML IJ SOLN
INTRAMUSCULAR | Status: AC
  Filled 2022-10-16: qty 2

## 2022-10-16 MED ORDER — FENTANYL CITRATE (PF) 100 MCG/2ML IJ SOLN
INTRAMUSCULAR | Status: AC
  Filled 2022-10-16: qty 2

## 2022-10-16 MED ORDER — LIDOCAINE 2% (20 MG/ML) 5 ML SYRINGE
INTRAMUSCULAR | Status: DC | PRN
  Administered 2022-10-16: 40 mg via INTRAVENOUS

## 2022-10-16 MED ORDER — EPHEDRINE SULFATE-NACL 50-0.9 MG/10ML-% IV SOSY
PREFILLED_SYRINGE | INTRAVENOUS | Status: DC | PRN
  Administered 2022-10-16 (×4): 5 mg via INTRAVENOUS

## 2022-10-16 MED ORDER — TRANEXAMIC ACID-NACL 1000-0.7 MG/100ML-% IV SOLN
1000.0000 mg | INTRAVENOUS | Status: AC
  Administered 2022-10-16: 1000 mg via INTRAVENOUS
  Filled 2022-10-16: qty 100

## 2022-10-16 MED ORDER — FENTANYL CITRATE (PF) 100 MCG/2ML IJ SOLN
INTRAMUSCULAR | Status: DC | PRN
  Administered 2022-10-16: 50 ug via INTRAVENOUS
  Administered 2022-10-16 (×2): 25 ug via INTRAVENOUS

## 2022-10-16 MED ORDER — LIDOCAINE HCL (PF) 2 % IJ SOLN
INTRAMUSCULAR | Status: AC
  Filled 2022-10-16: qty 5

## 2022-10-16 MED ORDER — ACETAMINOPHEN 325 MG PO TABS: 325.0000 mg | ORAL_TABLET | ORAL | Status: DC | PRN

## 2022-10-16 MED ORDER — BUPIVACAINE LIPOSOME 1.3 % IJ SUSP
INTRAMUSCULAR | Status: DC | PRN
  Administered 2022-10-16: 10 mL via PERINEURAL

## 2022-10-16 MED ORDER — ALBUTEROL SULFATE HFA 108 (90 BASE) MCG/ACT IN AERS
INHALATION_SPRAY | RESPIRATORY_TRACT | Status: DC | PRN
  Administered 2022-10-16: 5 via RESPIRATORY_TRACT

## 2022-10-16 MED ORDER — OXYCODONE HCL 5 MG/5ML PO SOLN: 5.0000 mg | Freq: Once | ORAL | Status: DC | PRN

## 2022-10-16 MED ORDER — CHLORHEXIDINE GLUCONATE 0.12 % MT SOLN
15.0000 mL | Freq: Once | OROMUCOSAL | Status: AC
  Administered 2022-10-16: 15 mL via OROMUCOSAL

## 2022-10-16 MED ORDER — 0.9 % SODIUM CHLORIDE (POUR BTL) OPTIME
TOPICAL | Status: DC | PRN
  Administered 2022-10-16: 1000 mL

## 2022-10-16 MED ORDER — DEXAMETHASONE SODIUM PHOSPHATE 10 MG/ML IJ SOLN
INTRAMUSCULAR | Status: AC
  Filled 2022-10-16: qty 1

## 2022-10-16 MED ORDER — KETAMINE HCL 10 MG/ML IJ SOLN
INTRAMUSCULAR | Status: DC | PRN
  Administered 2022-10-16: 20 mg via INTRAVENOUS
  Administered 2022-10-16: 30 mg via INTRAVENOUS

## 2022-10-16 MED ORDER — PHENYLEPHRINE HCL-NACL 20-0.9 MG/250ML-% IV SOLN
INTRAVENOUS | Status: DC | PRN
  Administered 2022-10-16: 35 ug/min via INTRAVENOUS

## 2022-10-16 MED ORDER — BACLOFEN 10 MG PO TABS: 10.0000 mg | ORAL_TABLET | Freq: Three times a day (TID) | ORAL | 0 refills | Status: DC

## 2022-10-16 MED ORDER — ONDANSETRON HCL 4 MG/2ML IJ SOLN
INTRAMUSCULAR | Status: DC | PRN
  Administered 2022-10-16: 4 mg via INTRAVENOUS

## 2022-10-16 MED ORDER — OXYCODONE HCL 5 MG PO TABS: 5.0000 mg | ORAL_TABLET | Freq: Three times a day (TID) | ORAL | 0 refills | Status: DC | PRN

## 2022-10-16 MED ORDER — SODIUM CHLORIDE 0.9 % IV SOLN: INTRAVENOUS | Status: DC | PRN

## 2022-10-16 MED ORDER — CEFAZOLIN SODIUM-DEXTROSE 2-4 GM/100ML-% IV SOLN
2.0000 g | INTRAVENOUS | Status: AC
  Administered 2022-10-16: 2 g via INTRAVENOUS
  Filled 2022-10-16: qty 100

## 2022-10-16 MED ORDER — LACTATED RINGERS IV BOLUS
500.0000 mL | Freq: Once | INTRAVENOUS | Status: AC
  Administered 2022-10-16: 500 mL via INTRAVENOUS

## 2022-10-16 MED ORDER — ALBUTEROL SULFATE HFA 108 (90 BASE) MCG/ACT IN AERS
INHALATION_SPRAY | RESPIRATORY_TRACT | Status: AC
  Filled 2022-10-16: qty 6.7

## 2022-10-16 MED ORDER — FENTANYL CITRATE PF 50 MCG/ML IJ SOSY: 25.0000 ug | PREFILLED_SYRINGE | INTRAMUSCULAR | Status: DC | PRN

## 2022-10-16 MED ORDER — ONDANSETRON HCL 4 MG/2ML IJ SOLN: 4.0000 mg | Freq: Once | INTRAMUSCULAR | Status: DC | PRN

## 2022-10-16 MED ORDER — LACTATED RINGERS IV SOLN: INTRAVENOUS | Status: DC

## 2022-10-16 MED ORDER — ACETAMINOPHEN 160 MG/5ML PO SOLN: 325.0000 mg | ORAL | Status: DC | PRN

## 2022-10-16 SURGICAL SUPPLY — 74 items
BAG COUNTER SPONGE SURGICOUNT (BAG) IMPLANT
BAG ZIPLOCK 12X15 (MISCELLANEOUS) ×1 IMPLANT
BASEPLATE AUG 24 20D (Plate) IMPLANT
BASEPLATE MOD POST AUGM MGS 20 (Plate) IMPLANT
BLADE SAW SGTL 83.5X18.5 (BLADE) ×1 IMPLANT
BNDG COHESIVE 4X5 TAN STRL LF (GAUZE/BANDAGES/DRESSINGS) ×1 IMPLANT
CALIBRATOR GLENOID VIP 5-D (SYSTAGENIX WOUND MANAGEMENT) IMPLANT
COOLER ICEMAN CLASSIC (MISCELLANEOUS) ×1 IMPLANT
COVER BACK TABLE 60X90IN (DRAPES) ×1 IMPLANT
COVER SURGICAL LIGHT HANDLE (MISCELLANEOUS) ×1 IMPLANT
CUP SUT UNIV REVERS 39 NEU (Shoulder) IMPLANT
DERMABOND ADVANCED .7 DNX12 (GAUZE/BANDAGES/DRESSINGS) ×1 IMPLANT
DRAPE INCISE IOBAN 66X45 STRL (DRAPES) IMPLANT
DRAPE ORTHO SPLIT 77X108 STRL (DRAPES) ×2
DRAPE SHEET LG 3/4 BI-LAMINATE (DRAPES) ×1 IMPLANT
DRAPE SURG 17X11 SM STRL (DRAPES) ×1 IMPLANT
DRAPE SURG ORHT 6 SPLT 77X108 (DRAPES) ×2 IMPLANT
DRAPE TOP 10253 STERILE (DRAPES) ×1 IMPLANT
DRAPE U-SHAPE 47X51 STRL (DRAPES) ×1 IMPLANT
DRESSING AQUACEL AG SP 3.5X6 (GAUZE/BANDAGES/DRESSINGS) ×1 IMPLANT
DRSG AQUACEL AG ADV 3.5X10 (GAUZE/BANDAGES/DRESSINGS) IMPLANT
DRSG AQUACEL AG SP 3.5X6 (GAUZE/BANDAGES/DRESSINGS) ×1
DRSG TEGADERM 8X12 (GAUZE/BANDAGES/DRESSINGS) ×1 IMPLANT
DURAPREP 26ML APPLICATOR (WOUND CARE) ×1 IMPLANT
ELECT BLADE TIP CTD 4 INCH (ELECTRODE) ×1 IMPLANT
ELECT PENCIL ROCKER SW 15FT (MISCELLANEOUS) ×1 IMPLANT
ELECT REM PT RETURN 15FT ADLT (MISCELLANEOUS) ×1 IMPLANT
FACESHIELD WRAPAROUND (MASK) ×4 IMPLANT
FACESHIELD WRAPAROUND OR TEAM (MASK) ×4 IMPLANT
FIBERTAPE CERCLAGE TLINK SUT (SUTURE) IMPLANT
GLENOSPHERE 39+4 LAT/24 UNI RV (Joint) IMPLANT
GLOVE BIO SURGEON STRL SZ7.5 (GLOVE) ×1 IMPLANT
GLOVE BIO SURGEON STRL SZ8 (GLOVE) ×1 IMPLANT
GLOVE SS BIOGEL STRL SZ 7 (GLOVE) ×1 IMPLANT
GLOVE SS BIOGEL STRL SZ 7.5 (GLOVE) ×1 IMPLANT
GOWN STRL SURGICAL XL XLNG (GOWN DISPOSABLE) ×2 IMPLANT
INSERT HUMERAL M/39 +3/CNSTRND (Miscellaneous) IMPLANT
KIT BASIN OR (CUSTOM PROCEDURE TRAY) ×1 IMPLANT
KIT INST ALLOG OATS 22.5 DISP (ORTHOPEDIC DISPOSABLE SUPPLIES) IMPLANT
KIT TURNOVER KIT A (KITS) IMPLANT
MANIFOLD NEPTUNE II (INSTRUMENTS) ×1 IMPLANT
NDL TAPERED W/ NITINOL LOOP (MISCELLANEOUS) ×1 IMPLANT
NEEDLE TAPERED W/ NITINOL LOOP (MISCELLANEOUS) ×1 IMPLANT
NS IRRIG 1000ML POUR BTL (IV SOLUTION) ×1 IMPLANT
PACK SHOULDER (CUSTOM PROCEDURE TRAY) ×1 IMPLANT
PAD ARMBOARD 7.5X6 YLW CONV (MISCELLANEOUS) ×1 IMPLANT
PAD COLD SHLDR WRAP-ON (PAD) ×1 IMPLANT
PIN NITINOL TARGETER 2.8 (PIN) IMPLANT
PIN SET MODULAR GLENOID SYSTEM (PIN) IMPLANT
REAMER ANGLED HEAD SMALL (DRILL) IMPLANT
RESTRAINT HEAD UNIVERSAL NS (MISCELLANEOUS) ×1 IMPLANT
SCREW PERIPHERAL 4.5X24 N/L (Screw) IMPLANT
SCREW PERIPHERAL 5.5X20 LOCK (Screw) IMPLANT
SCREW PERIPHERAL 5.5X28 LOCK (Screw) IMPLANT
SCREW PERIPHERAL NL 4.5X28 (Screw) IMPLANT
SLING ARM FOAM STRAP LRG (SOFTGOODS) IMPLANT
SLING ARM FOAM STRAP MED (SOFTGOODS) IMPLANT
SPACER SHLD UNI REV 39 +6 (Shoulder) IMPLANT
SPONGE T-LAP 4X18 ~~LOC~~+RFID (SPONGE) ×1 IMPLANT
STEM HUMERAL UNIVERS SZ8 (Stem) IMPLANT
SUCTION FRAZIER HANDLE 12FR (TUBING) ×1
SUCTION TUBE FRAZIER 12FR DISP (TUBING) ×1 IMPLANT
SUT FIBERWIRE #2 38 T-5 BLUE (SUTURE)
SUT MNCRL AB 3-0 PS2 18 (SUTURE) ×1 IMPLANT
SUT MON AB 2-0 CT1 36 (SUTURE) ×1 IMPLANT
SUT VIC AB 1 CT1 36 (SUTURE) ×1 IMPLANT
SUTURE FIBERWR #2 38 T-5 BLUE (SUTURE) IMPLANT
SUTURE TAPE 1.3 40 TPR END (SUTURE) ×2 IMPLANT
SUTURETAPE 1.3 40 TPR END (SUTURE) ×2
TOWEL OR 17X26 10 PK STRL BLUE (TOWEL DISPOSABLE) ×1 IMPLANT
TOWEL OR NON WOVEN STRL DISP B (DISPOSABLE) ×1 IMPLANT
TUBE SUCTION HIGH CAP CLEAR NV (SUCTIONS) ×1 IMPLANT
WATER STERILE IRR 1000ML POUR (IV SOLUTION) ×2 IMPLANT
YANKAUER SUCT BULB TIP 10FT TU (MISCELLANEOUS) IMPLANT

## 2022-10-16 NOTE — Transfer of Care (Signed)
Immediate Anesthesia Transfer of Care Note  Patient: David Griffin  Procedure(s) Performed: REVERSE SHOULDER ARTHROPLASTY (Left: Shoulder)  Patient Location: PACU  Anesthesia Type:General  Level of Consciousness: awake, alert  and oriented  Airway & Oxygen Therapy: Patient Spontanous Breathing and Patient connected to face mask oxygen  Post-op Assessment: Report given to RN and Post -op Vital signs reviewed and stable  Post vital signs: Reviewed and stable  Last Vitals:  Vitals Value Taken Time  BP 113/42 10/16/22 1033  Temp    Pulse 49 10/16/22 1036  Resp 17 10/16/22 1036  SpO2 100 % 10/16/22 1036  Vitals shown include unvalidated device data.  Last Pain:  Vitals:   10/16/22 0558  TempSrc:   PainSc: 3          Complications: No notable events documented.

## 2022-10-16 NOTE — Anesthesia Postprocedure Evaluation (Signed)
Anesthesia Post Note  Patient: David Griffin  Procedure(s) Performed: REVERSE SHOULDER ARTHROPLASTY (Left: Shoulder)     Patient location during evaluation: PACU Anesthesia Type: Regional and General Level of consciousness: awake and alert Pain management: pain level controlled Vital Signs Assessment: post-procedure vital signs reviewed and stable Respiratory status: spontaneous breathing, nonlabored ventilation, respiratory function stable and patient connected to nasal cannula oxygen Cardiovascular status: blood pressure returned to baseline and stable Postop Assessment: no apparent nausea or vomiting Anesthetic complications: no   No notable events documented.  Last Vitals:  Vitals:   10/16/22 0537 10/16/22 1033  BP: 136/75 (!) 113/42  Pulse: (!) 46 (!) 49  Resp: 17 (!) 22  Temp: 36.6 C 36.4 C  SpO2: 96% 100%    Last Pain:  Vitals:   10/16/22 0558  TempSrc:   PainSc: 3                  Ngina Royer

## 2022-10-16 NOTE — Anesthesia Procedure Notes (Signed)
Procedure Name: Intubation Date/Time: 10/16/2022 7:39 AM  Performed by: Maxwell Caul, CRNAPre-anesthesia Checklist: Patient identified, Emergency Drugs available, Suction available and Patient being monitored Patient Re-evaluated:Patient Re-evaluated prior to induction Oxygen Delivery Method: Circle system utilized Preoxygenation: Pre-oxygenation with 100% oxygen Induction Type: IV induction Ventilation: Mask ventilation without difficulty Laryngoscope Size: Mac and 4 Grade View: Grade I Tube type: Oral Number of attempts: 1 Airway Equipment and Method: Stylet Placement Confirmation: ETT inserted through vocal cords under direct vision, positive ETCO2 and breath sounds checked- equal and bilateral Secured at: 22 cm Tube secured with: Tape Dental Injury: Teeth and Oropharynx as per pre-operative assessment

## 2022-10-16 NOTE — Discharge Instructions (Signed)
 Kevin M. Supple, M.D., F.A.A.O.S. Orthopa[facility] Surgery Specializing in Arthroscopic and Reconstructive Surgery of the Shoulder [phone] [address] - [place], [place] [address] - Fax [phone]   POST-OP TOTAL SHOULDER REPLACEMENT INSTRUCTIONS  1. Follow up in the office for your first post-op appointment 10-14 days from the date of your surgery. If you do not already have a scheduled appointment, our office will contact you to schedule.  2. The bandage over your incision is waterproof. You may begin showering with this dressing on. You may leave this dressing on until first follow up appointment within 2 weeks. We prefer you leave this dressing in place until follow up however after 5-7 days if you are having itching or skin irritation and would like to remove it you may do so. Go slow and tug at the borders gently to break the bond the dressing has with the skin. At this point if there is no drainage it is okay to go without a bandage or you may cover it with a light guaze and tape. You can also expect significant bruising around your shoulder that will drift down your arm and into your chest wall. This is very normal and should resolve over several days.   3. Wear your sling/immobilizer at all times except to perform the exercises below or to occasionally let your arm dangle by your side to stretch your elbow. You also need to sleep in your sling immobilizer until instructed otherwise. It is ok to remove your sling if you are sitting in a controlled environment and allow your arm to rest in a position of comfort by your side or on your lap with pillows to give your neck and skin a break from the sling. You may remove it to allow arm to dangle by side to shower. If you are up walking around and when you go to sleep at night you need to wear it.  4. Range of motion to your elbow, wrist, and hand are encouraged 3-5 times daily. Exercise to your hand and fingers helps to reduce  swelling you may experience.   5. Prescriptions for a pain medication and a muscle relaxant are provided for you. It is recommended that if you are experiencing pain that you pain medication alone is not controlling, add the muscle relaxant along with the pain medication which can give additional pain relief. The first 1-2 days is generally the most severe of your pain and then should gradually decrease. As your pain lessens it is recommended that you decrease your use of the pain medications to an "as needed basis'" only and to always comply with the recommended dosages of the pain medications.  6. Pain medications can produce constipation along with their use. If you experience this, the use of an over the counter stool softener or laxative daily is recommended.   7. For additional questions or concerns, please do not hesitate to call the office. If after hours there is an answering service to forward your concerns to the physician on call.  8.Pain control following an exparel block  To help control your post-operative pain you received a nerve block  performed with Exparel which is a long acting anesthetic (numbing agent) which can provide pain relief and sensations of numbness (and relief of pain) in the operative shoulder and arm for up to 3 days. Sometimes it provides mixed relief, meaning you may still have numbness in certain areas of the arm but can still be able to   move  parts of that arm, hand, and fingers. We recommend that your prescribed pain medications  be used as needed. We do not feel it is necessary to "pre medicate" and "stay ahead" of pain.  Taking narcotic pain medications when you are not having any pain can lead to unnecessary and potentially dangerous side effects.    9. Use the ice machine as much as possible in the first 5-7 days from surgery, then you can wean its use to as needed. The ice typically needs to be replaced every 6 hours, instead of ice you can actually freeze  water bottles to put in the cooler and then fill water around them to avoid having to purchase ice. You can have spare water bottles freezing to allow you to rotate them once they have melted. Try to have a thin shirt or light cloth or towel under the ice wrap to protect your skin.   FOR ADDITIONAL INFO ON ICE MACHINE AND INSTRUCTIONS GO TO THE WEBSITE AT  [email or web address]  10.  We recommend that you avoid any dental work or cleaning in the first 3 months following your joint replacement. This is to help minimize the possibility of infection from the bacteria in your mouth that enters your bloodstream during dental work. We also recommend that you take an antibiotic prior to your dental work for the first year after your shoulder replacement to further help reduce that risk. Please simply contact our office for antibiotics to be sent to your pharmacy prior to dental work.  11. Dental Antibiotics:  We recommend waiting at least 3 months for any dental work even cleanings unless there is a IT consultant. We also recommend  prophylactic antibiotics for all dental procdeures  the first year following your joint replacement. In some exceptions we recommend them to be used lifelong. We will provide you with that prescription in follow up office visits, or you can call our office.  Exceptions are as follows:  1. History of prior total joint infection  2. Severely immunocompromised (Organ Transplant, cancer chemotherapy, Rheumatoid biologic meds such as [place])  3. Poorly controlled diabetes (A1C &gt; 8.0, blood glucose over 200)   POST-OP EXERCISES  Ok to allow arm to dangle by side for showers, hygiene and to stretch elbow. No shoulder exercises yet

## 2022-10-16 NOTE — Evaluation (Signed)
Occupational Therapy Evaluation Patient Details Name: David Griffin MRN: [id number] DOB: [birth year] Today's Date: 10/16/2022   History of Present Illness pt. 61 y/o male s/p L reverse total should arthroplasty.   Clinical Impression   Mr. David Griffin is a 61 y/o male s/p shoulder replacement without functional use of Left dominant upper extremity secondary to effects of surgery and interscalene block and shoulder precautions. Therapist provided education and instruction to patient and spouse in regards to precautions, positioning, donning upper extremity clothing and bathing while maintaining shoulder precautions, ice and edema management and donning/doffing sling. Patient and spouse verbalized understanding and demonstrated as needed. Patient needed assistance to donn shirt, underwear, and pants. Patient required increased physical and verbal cueing for maintaining shoulder precautions from therapist when donning shirt. Patient to follow up with MD for further therapy needs.       Recommendations for follow up therapy are one component of a multi-disciplinary discharge planning process, led by the attending physician.  Recommendations may be updated based on patient status, additional functional criteria and insurance authorization.   Follow Up Recommendations  No OT follow up    Assistance Recommended at Discharge PRN  Patient can return home with the following A little help with bathing/dressing/bathroom;Assistance with cooking/housework;Assist for transportation    Functional Status Assessment  Patient has had a recent decline in their functional status and demonstrates the ability to make significant improvements in function in a reasonable and predictable amount of time.  Equipment Recommendations  None recommended by OT    Recommendations for Other Services       Precautions / Restrictions Precautions Precautions: Shoulder Type of Shoulder Precautions: No AROM/PROM of  shoulder, wear sling at all times unless doing ADLs or exercises. dangle arm only, no shoulder exercises Shoulder Interventions: Shoulder sling/immobilizer Precaution Booklet Issued: Yes (comment) Restrictions Weight Bearing Restrictions: Yes LUE Weight Bearing: Non weight bearing      Mobility Bed Mobility                    Transfers Overall transfer level: Needs assistance   Transfers: Sit to/from Stand Sit to Stand: Min guard                  Balance Overall balance assessment: Needs assistance Sitting-balance support: Single extremity supported, Feet supported Sitting balance-Leahy Scale: Good     Standing balance support: No upper extremity supported, During functional activity Standing balance-Leahy Scale: Fair Standing balance comment: requires min guard for balance after surgery.                           ADL either performed or assessed with clinical judgement   ADL                                               Vision   Vision Assessment?: No apparent visual deficits     Perception     Praxis      Pertinent Vitals/Pain Pain Assessment Pain Assessment: Faces Faces Pain Scale: Hurts little more Pain Location: near near surgery site Pain Descriptors / Indicators: Grimacing     Hand Dominance     Extremity/Trunk Assessment Upper Extremity Assessment Upper Extremity Assessment: RUE deficits/detail;LUE deficits/detail RUE Deficits / Details: WFL-ROM LUE Deficits / Details: motor control and sensation impaired secondary to block  Communication     Cognition Arousal/Alertness: Awake/alert Behavior During Therapy: WFL for tasks assessed/performed Overall Cognitive Status: Within Functional Limits for tasks assessed                                       General Comments       Exercises     Shoulder Instructions Shoulder Instructions Donning/doffing shirt without moving  shoulder: Supervision/safety Method for sponge bathing under operated UE: Supervision/safety Donning/doffing sling/immobilizer: Supervision/safety Correct positioning of sling/immobilizer: Independent ROM for elbow, wrist and digits of operated UE: Independent Sling wearing schedule (on at all times/off for ADL's): Independent Proper positioning of operated UE when showering: Independent Positioning of UE while sleeping: [place] expects to be discharged to:: Private residence Living Arrangements: Spouse/significant other Available Help at Discharge: Family                                    Prior Functioning/Environment                          OT Problem List:        OT Treatment/Interventions:      OT Goals(Current goals can be found in the care plan section) Acute Rehab OT Goals OT Goal Formulation: All assessment and education complete, DC therapy  OT Frequency:      Co-evaluation              AM-PAC OT "6 Clicks" Daily Activity     Outcome Measure Help from another person eating meals?: None Help from another person taking care of personal grooming?: None Help from another person toileting, which includes using toliet, bedpan, or urinal?: A Little Help from another person bathing (including washing, rinsing, drying)?: A Little Help from another person to put on and taking off regular upper body clothing?: A Little Help from another person to put on and taking off regular lower body clothing?: A Little 6 Click Score: 20   End of Session Nurse Communication: Mobility status (patient has been cleared by OT)  Activity Tolerance: Patient tolerated treatment well Patient left: in chair;with family/visitor present  OT Visit Diagnosis: Muscle weakness (generalized) (M62.81)                Time: 4782-9562 OT Time Calculation (min): 19 min Charges:  OT General Charges $OT Visit: 1 Visit OT Evaluation $OT  Eval Low Complexity: [place], OTS Acute rehab services   Charlann Lange 10/16/2022, 5:02 PM

## 2022-10-16 NOTE — Op Note (Signed)
10/16/2022  10:18 AM  PATIENT:   David Griffin  61 y.o. 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  ASSISTANTS: Jenetta Loges, PA-C  Jenetta Loges, PA-C was utilized as an Environmental consultant throughout this case, essential for help with positioning the patient, positioning extremity, tissue manipulation, implantation of the prosthesis, suture management, wound closure, and intraoperative decision-making.  ANESTHESIA:   General endotracheal and interscalene block with Exparel  EBL: 800 cc  SPECIMEN: None  Fluids: 1 unit packed red blood cells  Drains: None   PATIENT DISPOSITION:  PACU - hemodynamically stable.    PLAN OF CARE: Discharge to home after PACU  Brief history:  Patient is a 61 year old male with a number of significant underlying chronic medical comorbidities with severe left shoulder pain weakness and associated functional limitations related to rotator cuff tear arthropathy with underlying significant bony deformity.  His symptoms have progressed to the point that his shoulder significantly impacts his quality of life.  Due to his increasing pain and functional imitations and failure to respond to prolonged attempts at conservative management, he is brought to the operating room at this time for planned left shoulder reverse arthroplasty.  Preoperatively, I counseled the patient regarding treatment options and risks versus benefits thereof.  Possible surgical complications were all reviewed including potential for bleeding, infection, neurovascular injury, persistent pain, loss of motion, anesthetic complication,  failure of the implant, and possible need for additional surgery. They understand and accept and agrees with our planned procedure.   Procedure in detail:  After undergoing routine preop evaluation the patient received prophylactic antibiotics and interscalene block with Exparel was established in the holding area by the anesthesia department.  Patient was subsequently placed supine on the operating table and underwent the smooth induction of a general endotracheal anesthesia.  Placed into the beachchair position and appropriately padded and protected.  The left shoulder girdle region was sterilely prepped and draped in standard fashion.  Timeout was called.  A deltopectoral approach left shoulder is made an approximate 10 cm incision.  Skin flaps were elevated dissection carried deeply and the deltopectoral interval was then developed from proximal to distal with the vein taken laterally.  The vein had multiple varicosities was very tortuous and engorged and several areas of the vein compromise were noted and the vein was then ligated both proximally and distally.  Adhesions were divided beneath the deltoid and the conjoined tendon was mobilized and retracted medially.  There was severe superior migration of the humeral head related to the chronic rotator cuff tear with associated erosion of the undersurface of the acromion and we carefully evacuated a large effusion from the shoulder joint then the residual capsule tissues were divided and excised.  The remnant of the subscapularis was then identified at its lesser tuberosity insertion and the margin was then tagged with a pair of suture tape sutures.  We then divided capsular attachments from the anterior and inferior margins of the humeral neck allowing deliver the humeral head through the wound.  The humeral head was severely eroded and deformed.  We performed our initial humeral head resection at approximate 20 degrees of retroversion using the  oscillating saw.  Due  to the extreme superior migration of the humeral head and the need to relocate the joint line we did make 2 additional cuts to the metaphyseal region to allow proper positioning.  These wedges of bone were preserved for later augmentation of the glenoid reconstruction.  Once the proper humeral head resection was performed we then placed a metal cap over the cut proximal humeral surface.  We then exposed the glenoid and again there was severe bony deformity and based on a preop CT scan identified the margins of the glenoid which were carefully dissected free and of note all surfaces bled profusely and we were very meticulous with her hemostasis but despite this it required the use of TXA sponges in addition to IV TXA and meticulous technique to keep bleeding to a minimum.  We circumferentially worked around the margins of the glenoid and identified the periphery and once we had appropriate access we then used our glenoid guide to identify the appropriate starting point.  The guidepin was then directed into the glenoid allowing correction for the significant deformity and the glenoid was then reamed with a 20 degree reamer based on the preoperative templates.  We then completed the preparation using the central drill for a 20 mm peg.  A section of harvested bone from the resected humeral head was then placed superiorly to help fill the bone void on the superior aspect of the glenoid and our baseplate was then assembled and we then impacted the baseplate into the glenoid using this to capture the superior wedge of bone with good purchase and fixation achieved.  We then placed an initial nonlocking screw to secure the baseplate and then completed fixation with the balance of the peripheral locking screws all of which achieved good bony purchase and fixation.  A 39/+4 glenosphere was then impacted onto the baseplate and central locking screw was placed.  At this point we then returned attention back  to the proximal humerus the canal was opened and we ultimately broached up to a size 8.  Metaphyseal reamer was then used to prepare the metaphysis.  A trial implant was placed and trial reduction showed good motion good stability good soft tissue balance.  This point the trial was then removed.  Our final implant was seated.  As we were beginning the final implant insertion we did notice areas where there were unicortical compromise about the cortical margin of the medial metaphysis and with this identified we placed a fiber cerclage around the humeral metaphysis and once this was secured we then performed terminal seating of our implant with which achieved excellent purchase and fixation.  A series of trial reductions was then performed and ultimately felt that a +6 spacer with a +3 poly constrained gave is the best motion stability and soft tissue balance.  Trial was then removed.  This final construct was then assembled.  Final reduction was performed which showed good motion good stability good soft tissue balance with good stability.  The wounds then copiously irrigated.  We then confirmed good elasticity of the subscapularis and was then repaired back to the eyelets on the collar of her implant.  We then confirmed that we had good stability and motion of the construct.  The wound was irrigated.  Final hemostasis was obtained we did use additional topical TXA at the completion of the case to ensure stable hemostasis.  The deltopectoral interval was then reapproximated with a series of figure-of-eight number Vicryl sutures.  2-0 Monocryl used to close  the subcu layer and intracuticular 3-0 Monocryl used to close the skin followed by Dermabond and Aquacel dressing.  The left arm was placed into a sling and the patient was awakened, extubated, and taken to the recovery room in stable condition.  Marin Shutter MD   Contact # [phone]

## 2022-10-16 NOTE — Anesthesia Procedure Notes (Signed)
Anesthesia Regional Block: Interscalene brachial plexus block   Pre-Anesthetic Checklist: , timeout performed,  Correct Patient, Correct Site, Correct Laterality,  Correct Procedure, Correct Position, site marked,  Risks and benefits discussed,  Surgical consent,  Pre-op evaluation,  At surgeon's request and post-op pain management  Laterality: Left  Prep: chloraprep       Needles:  Injection technique: Single-shot  Needle Type: Echogenic Stimulator Needle     Needle Length: 5cm  Needle Gauge: 22     Additional Needles:   Procedures:, nerve stimulator,,, ultrasound used (permanent image in chart),,     Nerve Stimulator or Paresthesia:  Response: hand, 0.45 mA  Additional Responses:   Narrative:  Start time: 10/16/2022 6:50 AM End time: 10/16/2022 6:55 AM Injection made incrementally with aspirations every 5 mL.  Performed by: Personally  Anesthesiologist: Janeece Riggers, MD  Additional Notes: Functioning IV was confirmed and monitors were applied.  A 36m 22ga Arrow echogenic stimulator needle was used. Sterile prep and drape,hand hygiene and sterile gloves were used. Ultrasound guidance: relevant anatomy identified, needle position confirmed, local anesthetic spread visualized around nerve(s)., vascular puncture avoided.  Image printed for medical record. Negative aspiration and negative test dose prior to incremental administration of local anesthetic. The patient tolerated the procedure well.

## 2022-10-16 NOTE — H&P (Signed)
David Griffin    Chief Complaint: Left shoulder rotator cuff tear arthropathy HPI: The patient is a 61 y.o. male with chronic and progressively increasing left shoulder pain related to severe rotator cuff tear arthropathy and significant underlying bony deformity.  Due to the patient's increasing functional limitations and failure to respond to prolonged attempts at conservative management, he is brought to the operating room at this time for planned left shoulder reverse arthroplasty  Past Medical History:  Diagnosis Date   Anemia    Anxiety    Arthritis    Cancer ([place])    liver- radiation treatment   Cerebral brain hemorrhage ([place])    2020 per pt   Cirrhosis of liver ([place])    Depression    Hepatitis    Hypothyroidism    Pre-diabetes    Substance abuse ([place])    Thyroid disease       Past Surgical History:  Procedure Laterality Date   ANKLE SURGERY     x 2   BACK SURGERY     HERNIA REPAIR     x 3   IR INTRAVASCULAR ULTRASOUND NON CORONARY  09/05/2021   IR PARACENTESIS  09/05/2021   IR RADIOLOGIST EVAL & MGMT  07/17/2021   IR RADIOLOGIST EVAL & MGMT  09/24/2021   IR RADIOLOGIST EVAL & MGMT  10/16/2021   IR RADIOLOGIST EVAL & MGMT  12/12/2021   IR RADIOLOGIST EVAL & MGMT  06/12/2022   IR TIPS  09/05/2021   IR US GUIDE VASC ACCESS RIGHT  09/05/2021   RADIOLOGY WITH ANESTHESIA N/A 09/05/2021   Procedure: RADIOLOGY WITH ANESTHESIA TIPS;  Surgeon: Suzette Battiest, MD;  Location: [facility];  Service: Radiology;  Laterality: N/A;    History reviewed. No pertinent family history.  Social History:  reports that he has been smoking cigarettes. He has a 46.00 pack-year smoking history. He has never used smokeless tobacco. He reports that he does not currently use alcohol. He reports current drug use. Drug: Marijuana.  Patient was counseled on smoking cessation prior to surgery  BMI: Body mass index is 31.41 kg/m.  Lab Results  Component Value Date   ALBUMIN 2.4 (L)  09/06/2021   Diabetes: Patient does not have a diagnosis of diabetes. Lab Results  Component Value Date   HGBA1C 4.9 10/03/2022     Smoking Status: Social History   Tobacco Use  Smoking Status Every Day   Packs/day: 1.00   Years: 46.00   Total pack years: 46.00   Types: Cigarettes  Smokeless Tobacco Never   Ready to quit: Not Answered Counseling given: Not Answered           Medications Prior to Admission  Medication Sig Dispense Refill   albuterol (VENTOLIN HFA) 108 (90 Base) MCG/ACT inhaler Inhale 2 puffs into the lungs every 6 (six) hours as needed for wheezing or shortness of breath.     baclofen (LIORESAL) 10 MG tablet Take 10 mg by mouth 3 (three) times daily.     carvedilol (COREG) 6.25 MG tablet Take 6.25 mg by mouth 2 (two) times daily.     escitalopram (LEXAPRO) 20 MG tablet Take 20 mg by mouth daily.     furosemide (LASIX) 20 MG tablet Take 20-40 mg by mouth See admin instructions. Take 40 mg in the morning and 20 mg in the afternoon     lactulose (CHRONULAC) 10 GM/15ML solution Take 30 g by mouth 3 (three) times daily as needed for constipation.  levothyroxine (SYNTHROID) 125 MCG tablet Take 125 mcg by mouth daily before breakfast.     Multiple Vitamin (MULTIVITAMIN WITH MINERALS) TABS tablet Take 1 tablet by mouth daily.     ondansetron (ZOFRAN) 4 MG tablet Take 4 mg by mouth every 8 (eight) hours as needed for vomiting or nausea.     ondansetron (ZOFRAN-ODT) 4 MG disintegrating tablet Take 4 mg by mouth every 8 (eight) hours as needed for nausea or vomiting.     spironolactone (ALDACTONE) 100 MG tablet Take 100 mg by mouth daily.     traZODone (DESYREL) 100 MG tablet Take 100 mg by mouth at bedtime.     XIFAXAN 550 MG TABS tablet Take 550 mg by mouth 2 (two) times daily.       Physical Exam: Left shoulder inspection demonstrates moderate diffuse swelling.  Skin is intact.  Profoundly limited motion with global weakness was noted at recent office  visits.  Plain radiographs as well as MRI scan and CT scan confirm changes consistent with chronic rotator cuff tear arthropathy.  There is significant bony deformity.  Preoperative CT scan has been utilized to develop operative plan.  Vitals  Temp:  [97.9 F (36.6 C)] 97.9 F (36.6 C) (10/19 0537) Pulse Rate:  [46] 46 (10/19 0537) Resp:  [17] 17 (10/19 0537) BP: (136)/(75) 136/75 (10/19 0537) SpO2:  [96 %] 96 % (10/19 0537) Weight:  [953 kg] 114 kg (10/19 0558)  Assessment/Plan  Impression: Left shoulder rotator cuff tear arthropathy  Plan of Action: Procedure(s): REVERSE SHOULDER ARTHROPLASTY  [name] M Quamir Willemsen 10/16/2022, 7:21 AM Contact # [phone]

## 2022-10-20 LAB — TYPE AND SCREEN
ABO/RH(D): A NEG
Antibody Screen: NEGATIVE
Unit division: 0
Unit division: 0

## 2022-10-20 LAB — BPAM RBC
Blood Product Expiration Date: 202311032359
Blood Product Expiration Date: 202311092359
ISSUE DATE / TIME: 202310190824
ISSUE DATE / TIME: 202310190824
Unit Type and Rh: 600
Unit Type and Rh: 600

## 2022-10-21 ENCOUNTER — Encounter (HOSPITAL_COMMUNITY): Payer: Self-pay | Admitting: Orthopedic Surgery

## 2022-11-10 ENCOUNTER — Other Ambulatory Visit: Payer: Self-pay | Admitting: Interventional Radiology

## 2022-11-10 DIAGNOSIS — K7031 Alcoholic cirrhosis of liver with ascites: Secondary | ICD-10-CM

## 2022-12-05 ENCOUNTER — Ambulatory Visit
Admission: RE | Admit: 2022-12-05 | Discharge: 2022-12-05 | Disposition: A | Discharge status: Home / Self Care | Payer: Medicare Other | Source: Ambulatory Visit | Attending: Interventional Radiology | Admitting: Interventional Radiology

## 2022-12-05 DIAGNOSIS — K7031 Alcoholic cirrhosis of liver with ascites: Secondary | ICD-10-CM

## 2022-12-05 HISTORY — PX: IR RADIOLOGIST EVAL & MGMT: IMG5224

## 2022-12-05 NOTE — Progress Notes (Signed)
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   He states he has been doing well.  No further significant hepatic encephalopathy.  Taking lactulose occasionally.  Continues on rifaximin daily.  No recurrence of ascites.  He endorses cramping in his hands which he is currently taking gabapentin for.  No jaundice.  He is still working as a Training and development officer.   Continues to follow up with [name], PA-C, last visit on 09/03/22.  CT AP performed on 08/26/22 - no hepatoma, patent TIPS, trace perihepatic ascites.     Past Medical History:  Diagnosis Date   Anemia    Anxiety    Arthritis    Cancer ([place])    liver- radiation treatment   Cerebral brain hemorrhage ([place])    2020 per pt   Cirrhosis of liver ([place])    Depression    Hepatitis    Hypothyroidism    Pre-diabetes    Substance abuse ([place])    Thyroid disease     Past Surgical History:  Procedure Laterality Date   ANKLE SURGERY     x 2   BACK SURGERY     HERNIA REPAIR     x 3   IR INTRAVASCULAR ULTRASOUND NON CORONARY  09/05/2021   IR PARACENTESIS  09/05/2021   IR RADIOLOGIST EVAL & MGMT  07/17/2021   IR RADIOLOGIST EVAL & MGMT  09/24/2021   IR RADIOLOGIST EVAL & MGMT  10/16/2021   IR RADIOLOGIST EVAL & MGMT  12/12/2021   IR RADIOLOGIST EVAL & MGMT  06/12/2022   IR TIPS  09/05/2021   IR US GUIDE VASC ACCESS RIGHT  09/05/2021   RADIOLOGY WITH ANESTHESIA N/A 09/05/2021   Procedure: RADIOLOGY WITH ANESTHESIA TIPS;  Surgeon: Suzette Battiest, MD;  Location: [facility];  Service: Radiology;  Laterality: N/A;   REVERSE SHOULDER ARTHROPLASTY Left 10/16/2022   Procedure: REVERSE SHOULDER ARTHROPLASTY;  Surgeon:  Justice Britain, MD;  Location: [facility];  Service: Orthopedics;  Laterality: Left;  165mn    Allergies: Phenergan [promethazine]  Medications: Prior to Admission medications   Medication Sig Start Date End Date Taking? Authorizing Provider  albuterol (VENTOLIN HFA) 108 (90 Base) MCG/ACT inhaler Inhale 2 puffs into the lungs every 6 (six) hours as needed for wheezing or shortness of breath.    [provider]  baclofen (LIORESAL) 10 MG tablet Take 1 tablet (10 mg total) by mouth 3 (three) times daily. 10/16/22   Shuford, TOlivia Mackie PA-C  carvedilol (COREG) 6.25 MG tablet Take 6.25 mg by mouth 2 (two) times daily.    [provider]  escitalopram (LEXAPRO) 20 MG tablet Take 20 mg by mouth daily.    [provider]  furosemide (LASIX) 20 MG tablet Take 20-40 mg by mouth See admin instructions. Take 40 mg in the morning and 20 mg in the afternoon    [provider]  lactulose (CHRONULAC) 10 GM/15ML solution Take 30 g by mouth 3 (three) times daily as needed for constipation. 03/06/21   [provider]  levothyroxine (SYNTHROID) 125 MCG tablet Take 125 mcg by mouth daily before breakfast.    [provider]  Multiple  Vitamin (MULTIVITAMIN WITH MINERALS) TABS tablet Take 1 tablet by mouth daily.    [provider]  ondansetron (ZOFRAN) 4 MG tablet Take 4 mg by mouth every 8 (eight) hours as needed for vomiting or nausea. 10/01/22   [provider]  ondansetron (ZOFRAN) 4 MG tablet Take 1 tablet (4 mg total) by mouth every 8 (eight) hours as needed for nausea or vomiting. 10/16/22   Shuford, Olivia Mackie, PA-C  ondansetron (ZOFRAN-ODT) 4 MG disintegrating tablet Take 4 mg by mouth every 8 (eight) hours as needed for nausea or vomiting. 08/05/22   [provider]  oxyCODONE (ROXICODONE) 5 MG immediate release tablet Take 1 tablet (5 mg total) by mouth every 8 (eight) hours as needed. 10/16/22 10/16/23  Shuford, Olivia Mackie, PA-C  spironolactone  (ALDACTONE) 100 MG tablet Take 100 mg by mouth daily. 10/01/22   [provider]  traZODone (DESYREL) 100 MG tablet Take 100 mg by mouth at bedtime.    [provider]  XIFAXAN 550 MG TABS tablet Take 550 mg by mouth 2 (two) times daily. 09/03/22   [provider]     No family history on file.  Social History   Socioeconomic History   Marital status: Divorced    Spouse name: Not on file   Number of children: Not on file   Years of education: Not on file   Highest education level: Not on file  Occupational History   Not on file  Tobacco Use   Smoking status: Every Day    Packs/day: 1.00    Years: 46.00    Total pack years: 46.00    Types: Cigarettes   Smokeless tobacco: Never  Vaping Use   Vaping Use: Never used  Substance and Sexual Activity   Alcohol use: Not Currently    Comment: last time 8-9 yeasrs ago   Drug use: Yes    Types: Marijuana    Comment: daily   Sexual activity: Not on file  Other Topics Concern   Not on file  Social History Narrative   Not on file   Social Determinants of Health   Financial Resource Strain: Not on file  Food Insecurity: Not on file  Transportation Needs: Not on file  Physical Activity: Not on file  Stress: Not on file  Social Connections: Not on file     Vital Signs: There were no vitals taken for this visit.  No physical examination was performed in lieu of virtual telephone clinic visit.  Imaging: CT AP 08/26/22 ([facility])  Patent TIPS.  Stable small focal SMV thrombus, non-occlusive.  Minimal perihepatic ascites.  Labs:  CBC: Recent Labs    10/03/22 1508 10/16/22 1147  WBC 5.3  --   HGB 11.1* 11.0*  HCT 32.8* 32.4*  PLT 89*  --     COAGS: No results for input(s): "INR", "APTT" in the last 8760 hours.  BMP: Recent Labs    10/03/22 1508  NA 138  K 3.6  CL 102  CO2 28  GLUCOSE 145*  BUN 35*  CALCIUM 8.9  CREATININE 1.90*  GFRNONAA 40*    LIVER FUNCTION TESTS: No results for  input(s): "BILITOT", "AST", "ALT", "ALKPHOS", "PROT", "ALBUMIN" in the last 8760 hours.  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  Recovering well with no recurrent  ascites, well controlled encephalopathy.  Continued mild hyperbilirubinemia on recent labs of uncertain etiology, will continue to monitor.   Follow up in 6 months, no imaging required, or sooner if necessary.   Ruthann Cancer, MD Pager: [phone] Clinic: [phone]    I spent a total of 25 Minutes in virtual telephone clinical consultation, greater than 50% of which was counseling/coordinating care for portal hypertension.

## 2023-01-21 ENCOUNTER — Other Ambulatory Visit: Payer: Self-pay | Admitting: Interventional Radiology

## 2023-01-21 DIAGNOSIS — K7031 Alcoholic cirrhosis of liver with ascites: Secondary | ICD-10-CM

## 2023-01-26 ENCOUNTER — Ambulatory Visit
Admission: RE | Admit: 2023-01-26 | Discharge: 2023-01-26 | Disposition: A | Discharge status: Home / Self Care | Payer: Medicare Other | Source: Ambulatory Visit | Attending: Interventional Radiology | Admitting: Interventional Radiology

## 2023-01-26 DIAGNOSIS — K7031 Alcoholic cirrhosis of liver with ascites: Secondary | ICD-10-CM

## 2023-01-26 HISTORY — PX: IR RADIOLOGIST EVAL & MGMT: IMG5224

## 2023-01-26 NOTE — Progress Notes (Signed)
Referring Physician(s): [name], PA-C   Reason for follow up:  Abnormal labs, swelling   History of present illness: 62 year old male with history of alcoholic cirrhosis complicated by portal hypertension and recurrent ascites (MELD 15 on 09/06/21), status post TIPS creation on 09/05/21 with decrease in mean portosystemic gradient from 30 mmHg to 7 mmHg.   Over the last month he has had multiple ED visits ([facility], [facility]) for jaundice, abdominal distention, and anemia.  He has not required additional paracentesis, as ultrasound has shown trace fluid.  He reports no bloody bowel movements or hematemesis.  Mild intermitted encephalopathy, responds well to  lactulose.  He continues to take rifaximin BID.  Jaundice fluctuates.  He complains of worsening lower extremity edema.    He has had abdominal ultrasound, CT abdomen pelvis with contrast, and MRI abdomen without contrast.  Imaging significant for patent TIPS, no evidence of portal thrombus, no evidence of hepatoma.     Continues to follow up with [name], PA-C, last visit on 09/03/22, with another visit planned for 02/11/23.     Past Medical History:  Diagnosis Date   Anemia    Anxiety    Arthritis    Cancer ([place])    liver- radiation treatment   Cerebral brain hemorrhage ([employer])    2020 per pt   Cirrhosis of liver ([place])    Depression    Hepatitis    Hypothyroidism    Pre-diabetes    Substance abuse ([place])    Thyroid disease     Past Surgical History:  Procedure Laterality Date   ANKLE SURGERY     x 2   BACK SURGERY     HERNIA REPAIR     x 3   IR INTRAVASCULAR ULTRASOUND NON CORONARY  09/05/2021   IR PARACENTESIS  09/05/2021   IR RADIOLOGIST EVAL & MGMT  07/17/2021   IR RADIOLOGIST EVAL & MGMT  09/24/2021   IR RADIOLOGIST EVAL & MGMT  10/16/2021   IR RADIOLOGIST EVAL & MGMT  12/12/2021   IR RADIOLOGIST EVAL & MGMT  06/12/2022   IR RADIOLOGIST EVAL & MGMT  12/05/2022   IR TIPS  09/05/2021   IR US GUIDE VASC  ACCESS RIGHT  09/05/2021   RADIOLOGY WITH ANESTHESIA N/A 09/05/2021   Procedure: RADIOLOGY WITH ANESTHESIA TIPS;  Surgeon: Suzette Battiest, MD;  Location: [facility];  Service: Radiology;  Laterality: N/A;   REVERSE SHOULDER ARTHROPLASTY Left 10/16/2022   Procedure: REVERSE SHOULDER ARTHROPLASTY;  Surgeon: Justice Britain, MD;  Location[facility];  Service: Orthopedics;  Laterality: Left;  160mn    Allergies: Phenergan [promethazine]  Medications: Prior to Admission medications   Medication Sig Start Date End Date Taking? Authorizing Provider  albuterol (VENTOLIN HFA) 108 (90 Base) MCG/ACT inhaler Inhale 2 puffs into the lungs every 6 (six) hours as needed for wheezing or shortness of breath.    [provider]  baclofen (LIORESAL) 10 MG tablet Take 1 tablet (10 mg total) by mouth 3 (three) times daily. 10/16/22   Shuford, TOlivia Mackie PA-C  carvedilol (COREG) 6.25 MG tablet Take 6.25 mg by mouth 2 (two) times daily.    [provider]  escitalopram (LEXAPRO) 20 MG tablet Take 20 mg by mouth daily.    [provider]  furosemide (LASIX) 20 MG tablet Take 20-40 mg by mouth See admin instructions. Take 40 mg in the morning and 20 mg in the afternoon    [provider]  lactulose (CHRONULAC) 10 GM/15ML solution Take 30 g by mouth 3 (three) times daily as needed for constipation. 03/06/21   [provider]  levothyroxine (SYNTHROID) 125 MCG tablet Take 125 mcg by mouth daily before breakfast.    [provider]  Multiple Vitamin (MULTIVITAMIN WITH MINERALS) TABS tablet Take 1 tablet by mouth daily.    [provider]  ondansetron (ZOFRAN) 4 MG tablet Take 4 mg by mouth every 8 (eight) hours as needed for vomiting or nausea. 10/01/22   [provider]  ondansetron (ZOFRAN) 4 MG tablet Take 1 tablet (4 mg total) by mouth every 8 (eight) hours as needed for nausea or vomiting. 10/16/22   Shuford, Olivia Mackie, PA-C  ondansetron (ZOFRAN-ODT) 4 MG  disintegrating tablet Take 4 mg by mouth every 8 (eight) hours as needed for nausea or vomiting. 08/05/22   [provider]  oxyCODONE (ROXICODONE) 5 MG immediate release tablet Take 1 tablet (5 mg total) by mouth every 8 (eight) hours as needed. 10/16/22 10/16/23  Shuford, Olivia Mackie, PA-C  spironolactone (ALDACTONE) 100 MG tablet Take 100 mg by mouth daily. 10/01/22   [provider]  traZODone (DESYREL) 100 MG tablet Take 100 mg by mouth at bedtime.    [provider]  XIFAXAN 550 MG TABS tablet Take 550 mg by mouth 2 (two) times daily. 09/03/22   [provider]     No family history on file.  Social History   Socioeconomic History   Marital status: Divorced    Spouse name: Not on file   Number of children: Not on file   Years of education: Not on file   Highest education level: Not on file  Occupational History   Not on file  Tobacco Use   Smoking status: Every Day    Packs/day: 1.00    Years: 46.00    Total pack years: 46.00    Types: Cigarettes   Smokeless tobacco: Never  Vaping Use   Vaping Use: Never used  Substance and Sexual Activity   Alcohol use: Not Currently    Comment: last time 8-9 yeasrs ago   Drug use: Yes    Types: Marijuana    Comment: daily   Sexual activity: Not on file  Other Topics Concern   Not on file  Social History Narrative   Not on file   Social Determinants of Health   Financial Resource Strain: Not on file  Food Insecurity: Not on file  Transportation Needs: Not on file  Physical Activity: Not on file  Stress: Not on file  Social Connections: Not on file     Vital Signs: Wt 110.2 kg   BMI 30.37 kg/m   Physical Exam Constitutional:      General: He is not in acute distress. HENT:     Head: Normocephalic.     Mouth/Throat:     Mouth: Mucous membranes are moist.  Eyes:     General: Scleral icterus present.  Cardiovascular:     Rate and Rhythm: Normal rate and regular rhythm.  Pulmonary:      Effort: No respiratory distress.  Abdominal:     General: There is distension.     Tenderness: There is no abdominal tenderness.  Musculoskeletal:     Right lower leg: Edema present.     Left lower leg: Edema present.  Skin:    General: Skin is warm and dry.     Coloration: Skin is jaundiced.  Neurological:     Mental Status: He  is alert and oriented to person, place, and time.  Psychiatric:        Mood and Affect: Mood normal.        Behavior: Behavior normal.        Thought Content: Thought content normal.        Judgment: Judgment normal.     Imaging: CT AP 01/05/23  Patent TIPS. Stable small focal SMV thrombus, non-occlusive. Minimal perihepatic ascites.   MRI/MRCP 01/07/23 IMPRESSION: 1. Advanced hepatic cirrhosis with nodular contour and internal micro nodularity throughout the liver scarring in the dome of the right hepatic lobe corresponding to prior treated tumor. No compelling indicators of active malignancy although today's exam was performed without IV contrast and is adversely affected by motion artifact. 2. No intrahepatic or extrahepatic biliary dilatation. Gallbladder contracted but otherwise unremarkable. 3. Upper abdominal ascites. 4. Mild splenomegaly.    Labs: 01/23/23 WBC 5.8, Hb 8.6, Hct 25.6, Plt 193  Na 134, K 4.3, Cl 102, CO2 26, BUN 33, Cr 1.8  INR 1.33 (01/09/23)  01/17/23 AST 609, ALT 317, Alk Phos 149, T Bili 4.1, Albumin 2.2  Afp  3.8 (08/26/22) --> 6.8 (01/06/23)  Child Pugh C (11 pts) MELD 21 MELD-Na 23   Assessment and Plan: 62 year old male with history of alcoholic cirrhosis complicated by portal hypertension and recurrent ascites (MELD 13 on 06/04/22-->21 on 01/23/23), status post TIPS creation on 09/05/21 with decrease in mean portosystemic gradient from 30 mmHg to 7 mmHg.    He presents with signs of worsening liver function including hyperbilirubinemia with jaundice, transaminitis, hypoalbuminemia, hepatorenal syndrome, and  increased AFP.  Imaging studies show no evidence of hepatoma and patent TIPS.  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  No increase in intermittent, mild encephalopathy well controlled medically. He has had some third spacing into his lower extremities and mild ascites accumulation, not yet warranting paracentesis.  Etiology of progressive hepatic decompensation is indeterminate. He follows up with [name] in a couple weeks.  While unlikely, it is plausible that the TIPS may need continued expansion, or portal thrombus has worsening to a greater degree than seen on CT.  Therefore, we discussed TIPS check and possible revision, likely balloon expansion to 10 mm if portosystemic gradient is elevated.  He and his wife are amenable and would like to proceed with this.  -plan for TIPS check at [place], MD Pager: [phone] Clinic: [phone]    I spent a total of 40 Minutes in face to face in clinical consultation, greater than 50% of which was counseling/coordinating care for portal hypertension.

## 2023-01-27 ENCOUNTER — Other Ambulatory Visit (HOSPITAL_COMMUNITY): Payer: Self-pay | Admitting: Interventional Radiology

## 2023-01-27 ENCOUNTER — Encounter (HOSPITAL_COMMUNITY): Payer: Self-pay

## 2023-01-27 DIAGNOSIS — K7031 Alcoholic cirrhosis of liver with ascites: Secondary | ICD-10-CM

## 2023-01-28 ENCOUNTER — Other Ambulatory Visit: Payer: Self-pay | Admitting: Radiology

## 2023-01-28 DIAGNOSIS — K766 Portal hypertension: Secondary | ICD-10-CM

## 2023-01-29 ENCOUNTER — Other Ambulatory Visit: Payer: Self-pay

## 2023-01-29 ENCOUNTER — Other Ambulatory Visit (HOSPITAL_COMMUNITY): Payer: Self-pay | Admitting: Interventional Radiology

## 2023-01-29 ENCOUNTER — Ambulatory Visit (HOSPITAL_COMMUNITY)
Admission: RE | Admit: 2023-01-29 | Discharge: 2023-01-29 | Disposition: A | Discharge status: Home / Self Care | Payer: Medicare Other | Source: Ambulatory Visit | Attending: Interventional Radiology | Admitting: Interventional Radiology

## 2023-01-29 ENCOUNTER — Encounter (HOSPITAL_COMMUNITY): Payer: Self-pay

## 2023-01-29 DIAGNOSIS — K7031 Alcoholic cirrhosis of liver with ascites: Secondary | ICD-10-CM | POA: Diagnosis present

## 2023-01-29 DIAGNOSIS — K766 Portal hypertension: Secondary | ICD-10-CM | POA: Diagnosis not present

## 2023-01-29 HISTORY — PX: IR VENOGRAM HEPATIC W HEMODYNAMIC EVALUATION: IMG692

## 2023-01-29 HISTORY — PX: IR US GUIDE VASC ACCESS RIGHT: IMG2390

## 2023-01-29 LAB — CBC
HCT: 30.9 % — ABNORMAL LOW (ref 39.0–52.0)
Hemoglobin: 10 g/dL — ABNORMAL LOW (ref 13.0–17.0)
MCH: 31.2 pg (ref 26.0–34.0)
MCHC: 32.4 g/dL (ref 30.0–36.0)
MCV: 96.3 fL (ref 80.0–100.0)
Platelets: 308 10*3/uL (ref 150–400)
RBC: 3.21 MIL/uL — ABNORMAL LOW (ref 4.22–5.81)
RDW: 18.4 % — ABNORMAL HIGH (ref 11.5–15.5)
WBC: 8.6 10*3/uL (ref 4.0–10.5)
nRBC: 0 % (ref 0.0–0.2)

## 2023-01-29 LAB — BASIC METABOLIC PANEL
Anion gap: 6 (ref 5–15)
BUN: 23 mg/dL — ABNORMAL HIGH (ref 6–20)
CO2: 25 mmol/L (ref 22–32)
Calcium: 8.2 mg/dL — ABNORMAL LOW (ref 8.9–10.3)
Chloride: 100 mmol/L (ref 98–111)
Creatinine, Ser: 1.42 mg/dL — ABNORMAL HIGH (ref 0.61–1.24)
GFR, Estimated: 57 mL/min — ABNORMAL LOW (ref >60)
Glucose, Bld: 112 mg/dL — ABNORMAL HIGH (ref 70–99)
Potassium: 5.5 mmol/L — ABNORMAL HIGH (ref 3.5–5.1)
Sodium: 131 mmol/L — ABNORMAL LOW (ref 135–145)

## 2023-01-29 LAB — PROTIME-INR
INR: 1.3 — ABNORMAL HIGH (ref 0.8–1.2)
Prothrombin Time: 15.8 seconds — ABNORMAL HIGH (ref 11.4–15.2)

## 2023-01-29 MED ORDER — MIDAZOLAM HCL 2 MG/2ML IJ SOLN
INTRAMUSCULAR | Status: AC
  Filled 2023-01-29: qty 2

## 2023-01-29 MED ORDER — LIDOCAINE HCL 1 % IJ SOLN
INTRAMUSCULAR | Status: AC
  Filled 2023-01-29: qty 20

## 2023-01-29 MED ORDER — MIDAZOLAM HCL 2 MG/2ML IJ SOLN
INTRAMUSCULAR | Status: AC | PRN
  Administered 2023-01-29: 1 mg via INTRAVENOUS

## 2023-01-29 MED ORDER — SODIUM CHLORIDE 0.9 % IV SOLN: INTRAVENOUS | Status: DC

## 2023-01-29 MED ORDER — FENTANYL CITRATE (PF) 100 MCG/2ML IJ SOLN
INTRAMUSCULAR | Status: AC
  Filled 2023-01-29: qty 2

## 2023-01-29 MED ORDER — LIDOCAINE-EPINEPHRINE 1 %-1:100000 IJ SOLN
INTRAMUSCULAR | Status: AC
  Administered 2023-01-29: 10 mL
  Filled 2023-01-29: qty 1

## 2023-01-29 MED ORDER — SODIUM CHLORIDE 0.9 % IV SOLN
INTRAVENOUS | Status: AC
  Administered 2023-01-29: 2 g via INTRAVENOUS
  Filled 2023-01-29: qty 20

## 2023-01-29 MED ORDER — CEFAZOLIN SODIUM-DEXTROSE 2-4 GM/100ML-% IV SOLN
INTRAVENOUS | Status: AC
  Filled 2023-01-29: qty 100

## 2023-01-29 MED ORDER — IOHEXOL 300 MG/ML  SOLN
100.0000 mL | Freq: Once | INTRAMUSCULAR | Status: AC | PRN
  Administered 2023-01-29: 30 mL via INTRAVENOUS

## 2023-01-29 MED ORDER — SODIUM CHLORIDE 0.9 % IV SOLN: 2.0000 g | INTRAVENOUS | Status: AC

## 2023-01-29 MED ORDER — FENTANYL CITRATE (PF) 100 MCG/2ML IJ SOLN
INTRAMUSCULAR | Status: AC | PRN
  Administered 2023-01-29: 25 ug via INTRAVENOUS

## 2023-01-29 NOTE — H&P (Signed)
Chief Complaint: Patient was seen in consultation today for TIPS evaluation/revision at the request of David Griffin,David Griffin  Referring Physician(s): Chilton Griffin [facility]  Supervising Physician: David Griffin  Patient Status: [facility] - Out-pt  History of Present Illness: David Griffin is a 62 y.o. male   ETOH Cirrhosis; portal HTN- TIPS in IR 09/05/21 Recurrent ascites Many recent ED visits secondary jaundice; abd distension and anemia' No bloody BM or hematemesis Taking Lactulose and Rifaximin BID  He has had abdominal ultrasound, CT abdomen pelvis with contrast, and MRI abdomen without contrast. Imaging significant for patent TIPS, no evidence of portal thrombus, no evidence of hepatoma.   MRI/MRCP 01/07/23 IMPRESSION: 1. Advanced hepatic cirrhosis with nodular contour and internal micro nodularity throughout the liver scarring in the dome of the right hepatic lobe corresponding to prior treated tumor. No compelling indicators of active malignancy although today's exam was performed without IV contrast and is adversely affected by motion artifact. 2. No intrahepatic or extrahepatic biliary dilatation. Gallbladder contracted but otherwise unremarkable. 3. Upper abdominal ascites. 4. Mild splenomegaly.    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        He presents with signs of worsening liver function including hyperbilirubinemia with jaundice, transaminitis, hypoalbuminemia, hepatorenal syndrome, and increased AFP.  Imaging studies show no evidence of hepatoma and patent TIPS.  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  No increase in intermittent, mild encephalopathy well controlled  medically. He has had some third spacing into his lower extremities and mild ascites accumulation, not yet warranting paracentesis.      Etiology of progressive hepatic decompensation is indeterminate. He follows up with Chilton Griffin in a couple weeks.  While unlikely, it is plausible that the TIPS may need continued expansion, or portal thrombus has worsening to a greater degree than seen on CT.  Therefore, we discussed TIPS check and possible revision, likely balloon expansion to 10 mm if portosystemic gradient is elevated.  He and his wife are amenable and would like to proceed with this.  Scheduled today for same   Past Medical History:  Diagnosis Date   Anemia    Anxiety    Arthritis    Griffin (David Griffin)    liver- radiation treatment   Cerebral brain hemorrhage (David Griffin)    2020 per pt   Cirrhosis of liver (David Griffin)    Depression    Hepatitis    Hypothyroidism    Pre-diabetes    Substance abuse (David Griffin)    Thyroid disease     Past Surgical History:  Procedure Laterality Date   ANKLE SURGERY     x 2   BACK SURGERY     HERNIA REPAIR     x 3   IR INTRAVASCULAR ULTRASOUND NON CORONARY  09/05/2021   IR PARACENTESIS  09/05/2021   IR RADIOLOGIST EVAL & MGMT  07/17/2021   IR RADIOLOGIST EVAL & MGMT  09/24/2021   IR RADIOLOGIST EVAL & MGMT  10/16/2021   IR RADIOLOGIST EVAL & MGMT  12/12/2021   IR RADIOLOGIST EVAL & MGMT  06/12/2022   IR RADIOLOGIST EVAL & MGMT  12/05/2022   IR RADIOLOGIST EVAL & MGMT  01/26/2023   IR TIPS  09/05/2021   IR US  GUIDE VASC ACCESS RIGHT  09/05/2021   RADIOLOGY WITH ANESTHESIA N/A 09/05/2021   Procedure: RADIOLOGY WITH ANESTHESIA TIPS;  Surgeon: Suzette Battiest, MD;  Location: [place];  Service: Radiology;  Laterality: N/A;   REVERSE SHOULDER ARTHROPLASTY Left 10/16/2022   Procedure: REVERSE SHOULDER ARTHROPLASTY;  Surgeon: Justice Britain, MD;  Location: [facility];  Service: Orthopedics;  Laterality: Left;  145mn    Allergies: Phenergan  [promethazine]  Medications: Prior to Admission medications   Medication Sig Start Date End Date Taking? Authorizing Provider  baclofen (LIORESAL) 10 MG tablet Take 1 tablet (10 mg total) by mouth 3 (three) times daily. 10/16/22  Yes Shuford, TOlivia Mackie PA-C  escitalopram (LEXAPRO) 20 MG tablet Take 20 mg by mouth daily.   Yes [provider]  furosemide (LASIX) 20 MG tablet Take 20-40 mg by mouth See admin instructions. Take 40 mg in the morning and 20 mg in the afternoon   Yes [provider]  lactulose (CHRONULAC) 10 GM/15ML solution Take 30 g by mouth 3 (three) times daily as needed for constipation. 03/06/21  Yes [provider]  levothyroxine (SYNTHROID) 125 MCG tablet Take 125 mcg by mouth daily before breakfast.   Yes [provider]  Multiple Vitamin (MULTIVITAMIN WITH MINERALS) TABS tablet Take 1 tablet by mouth daily.   Yes [provider]  ondansetron (ZOFRAN) 4 MG tablet Take 4 mg by mouth every 8 (eight) hours as needed for vomiting or nausea. 10/01/22  Yes [provider]  ondansetron (ZOFRAN) 4 MG tablet Take 1 tablet (4 mg total) by mouth every 8 (eight) hours as needed for nausea or vomiting. 10/16/22  Yes Shuford, TOlivia Mackie PA-C  ondansetron (ZOFRAN-ODT) 4 MG disintegrating tablet Take 4 mg by mouth every 8 (eight) hours as needed for nausea or vomiting. 08/05/22  Yes [provider]  spironolactone (ALDACTONE) 100 MG tablet Take 100 mg by mouth daily. 10/01/22  Yes [provider]  traZODone (DESYREL) 100 MG tablet Take 100 mg by mouth at bedtime.   Yes [provider]  XIFAXAN 550 MG TABS tablet Take 550 mg by mouth 2 (two) times daily. 09/03/22  Yes [provider]  albuterol (VENTOLIN HFA) 108 (90 Base) MCG/ACT inhaler Inhale 2 puffs into the lungs every 6 (six) hours as needed for wheezing or shortness of breath.    [provider]  carvedilol (COREG) 6.25 MG tablet Take 6.25 mg by mouth 2  (two) times daily.    [provider]  oxyCODONE (ROXICODONE) 5 MG immediate release tablet Take 1 tablet (5 mg total) by mouth every 8 (eight) hours as needed. 10/16/22 10/16/23  Shuford, TOlivia Mackie PA-C     History reviewed. No pertinent family history.  Social History   Socioeconomic History   Marital status: Divorced    Spouse name: Not on file   Number of children: Not on file   Years of education: Not on file   Highest education level: Not on file  Occupational History   Not on file  Tobacco Use   Smoking status: Every Day    Packs/day: 1.00    Years: 46.00    Total pack years: 46.00    Types: Cigarettes   Smokeless tobacco: Never  Vaping Use   Vaping Use: Never used  Substance and Sexual Activity   Alcohol use: Not Currently    Comment: last time 8-9 yeasrs ago   Drug use: Yes    Types: Marijuana    Comment: daily   Sexual  activity: Not on file  Other Topics Concern   Not on file  Social History Narrative   Not on file   Social Determinants of Health   Financial Resource Strain: Not on file  Food Insecurity: Not on file  Transportation Needs: Not on file  Physical Activity: Not on file  Stress: Not on file  Social Connections: Not on file    Review of Systems: A 12 point ROS discussed and pertinent positives are indicated in the HPI above.  All other systems are negative.  Review of Systems  Constitutional:  Positive for activity change, appetite change and fatigue.  Respiratory:  Positive for shortness of breath.   Gastrointestinal:  Positive for abdominal distention and abdominal pain.  Psychiatric/Behavioral:  Negative for behavioral problems and confusion.     Vital Signs: Temp (!) 96.9 F (36.1 C)   Resp 16   Ht '6\' 3"'$  (1.905 m)   Wt 242 lb (109.8 kg)   BMI 30.25 kg/m     Physical Exam Vitals reviewed.  HENT:     Mouth/Throat:     Mouth: Mucous membranes are moist.  Eyes:     General: Scleral icterus present.     Extraocular  Movements: Extraocular movements intact.  Cardiovascular:     Rate and Rhythm: Normal rate and regular rhythm.     Heart sounds: Normal heart sounds.  Pulmonary:     Effort: Pulmonary effort is normal.     Breath sounds: Normal breath sounds. No wheezing.  Abdominal:     General: There is distension.     Palpations: Abdomen is soft.     Tenderness: There is abdominal tenderness.  Musculoskeletal:        General: Normal range of motion.  Skin:    General: Skin is warm.  Neurological:     Mental Status: He is alert and oriented to person, place, and time.  Psychiatric:        Behavior: Behavior normal.     Imaging: IR Radiologist Eval & Mgmt  Result Date: 01/26/2023 EXAM: ESTABLISHED PATIENT OFFICE VISIT CHIEF COMPLAINT: See Epic note. HISTORY OF PRESENT ILLNESS: See Epic note. REVIEW OF SYSTEMS: See Epic note. PHYSICAL EXAMINATION: See Epic note. ASSESSMENT AND PLAN: See Epic note. David Cancer, MD [facility] Radiology Electronically Signed   By: David Griffin M.D.   On: 01/26/2023 09:52    Labs:  CBC: Recent Labs    10/03/22 1508 10/16/22 1147 01/29/23 0803  WBC 5.3  --  8.6  HGB 11.1* 11.0* 10.0*  HCT 32.8* 32.4* 30.9*  PLT 89*  --  308    COAGS: No results for input(s): "INR", "APTT" in the last 8760 hours.  BMP: Recent Labs    10/03/22 1508  NA 138  K 3.6  CL 102  CO2 28  GLUCOSE 145*  BUN 35*  CALCIUM 8.9  CREATININE 1.90*  GFRNONAA 40*    LIVER FUNCTION TESTS: No results for input(s): "BILITOT", "AST", "ALT", "ALKPHOS", "PROT", "ALBUMIN" in the last 8760 hours.  TUMOR MARKERS: No results for input(s): "AFPTM", "CEA", "CA199", "CHROMGRNA" in the last 8760 hours.  Assessment and Plan:  TIPS placed 09/05/21 Follows with GI Referred to Dr Serafina Royals for evaluation secondary jaundice; anemia; abd distension Consult done 01/26/23 Scheduled now for TIPS evaluation/revision in IR Pt is aware of procedure  benefits and risks including but not limited to; infection; bleeding; organ damage; damage to surrounding structures Agreeable to proceed Consent signed and in  chart   Thank you for this interesting consult.  I greatly enjoyed meeting KALLUM JORGENSEN and look forward to participating in their care.  A copy of this report was sent to the requesting provider on this date.  Electronically Signed: Lavonia Drafts, PA-C 01/29/2023, 8:28 AM   I spent a total of    25 Minutes in face to face in clinical consultation, greater than 50% of which was counseling/coordinating care for TIPs evaluation/revision

## 2023-01-29 NOTE — Sedation Documentation (Signed)
Pre: RA Pressure 15

## 2023-01-29 NOTE — Procedures (Signed)
Interventional Radiology Procedure Note  Procedure: TIPS check  Findings: Please refer to procedural dictation for full description. Patent TIPS.  High normal mean portosystemic pressure gradient (RA 15, main portal 27, gradient 12 mmHg).  No intervention.  Complications: None immediate  Estimated Blood Loss: < 5 mL  Recommendations: Follow up in IR clinic in 6 months.   Ruthann Cancer, MD Pager: [phone] Clinic: [phone]

## 2023-01-29 NOTE — Sedation Documentation (Signed)
Pre: Main Portal Vein Pressure 27

## 2023-03-30 DEATH — deceased

## 2023-08-25 IMAGING — CT CT SHOULDER*L* W/O CM
2 series · 10 of 14 positions shown, 12 images · non-contrast
Comparison: None Available.

CLINICAL DATA: Eval glenoid for preop, decreased range of motion

EXAM:
CT OF THE UPPER LEFT EXTREMITY WITHOUT CONTRAST
TECHNIQUE: Multidetector CT imaging of the upper left extremity was performed
according to the standard protocol.
RADIATION DOSE REDUCTION: This exam was performed according to the
departmental dose-optimization program which includes automated
exposure control, adjustment of the mA and/or kV according to
patient size and/or use of iterative reconstruction technique.

[Series 3: (id) st ax · axial · 0.42mm/px · z∈[-138,-42]mm · 3 of 98 slices shown]
[im 25/98  bone]
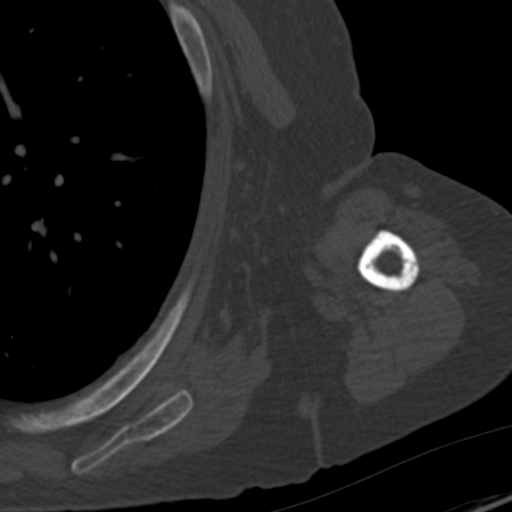
[im 49/98  bone]
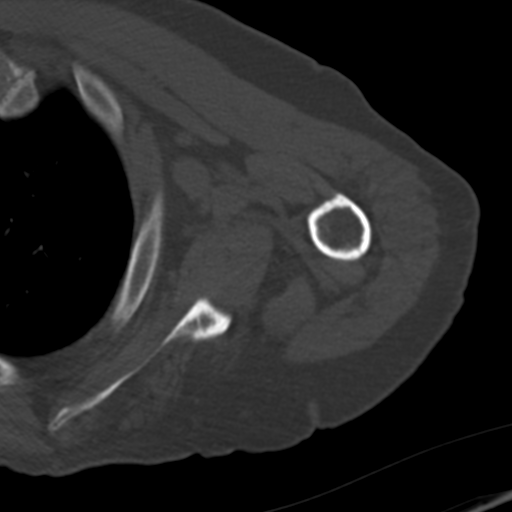
[im 73/98  bone]
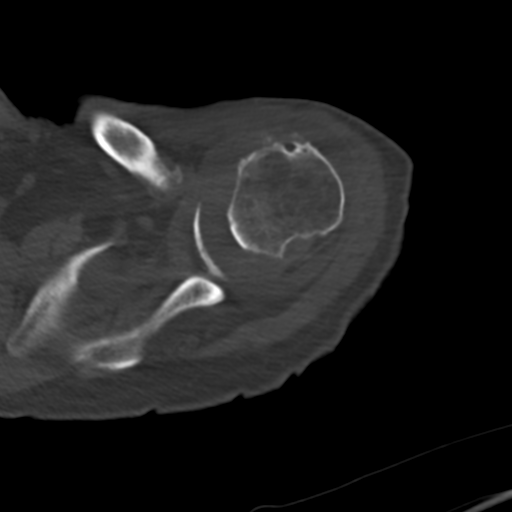

[Series 5: thin st · axial · 0.42mm/px · z∈[-162,-16]mm · 7 of 196 slices shown, 9 images]
[im 25/196  soft-tissue]
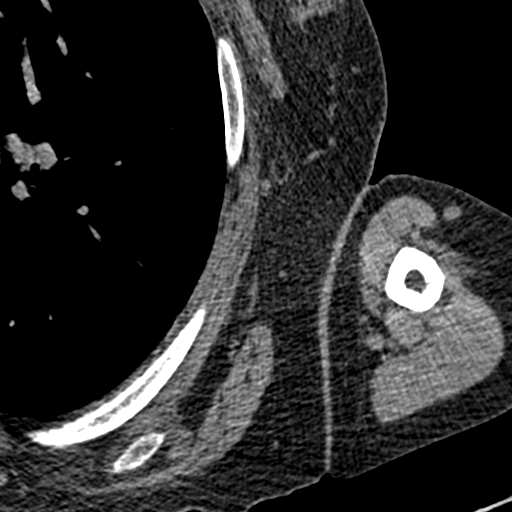
[im 25/196  bone]
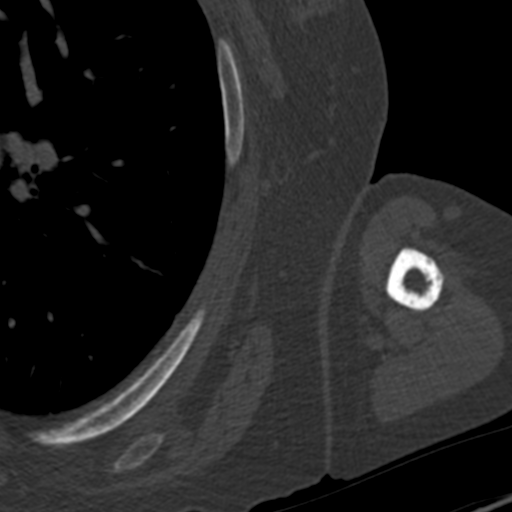
[im 49/196  bone]
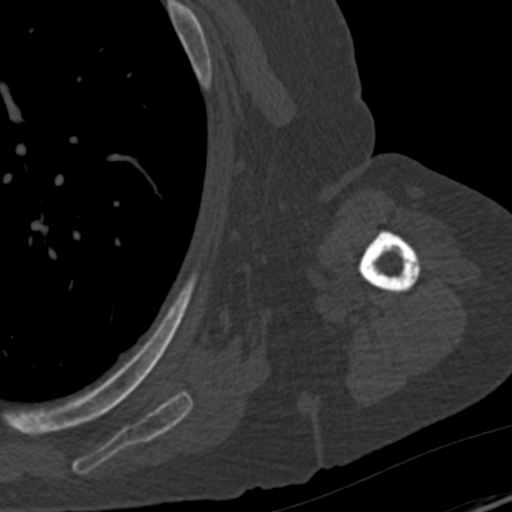
[im 74/196  bone]
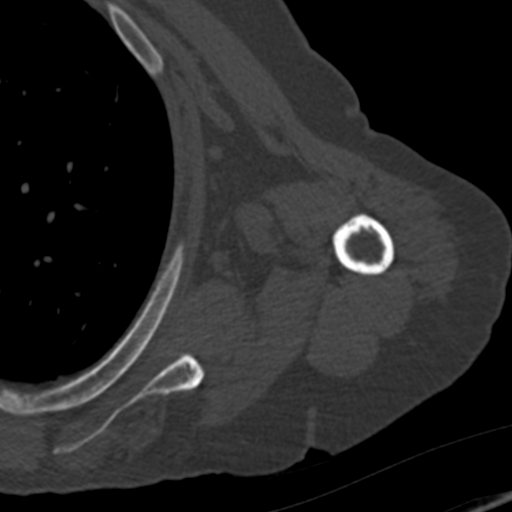
[im 98/196  bone]
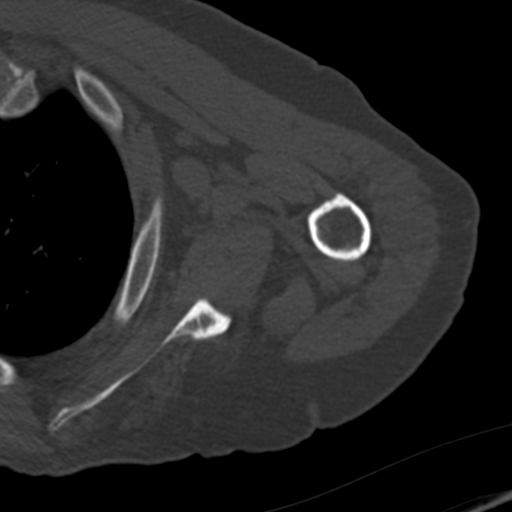
[im 122/196  soft-tissue]
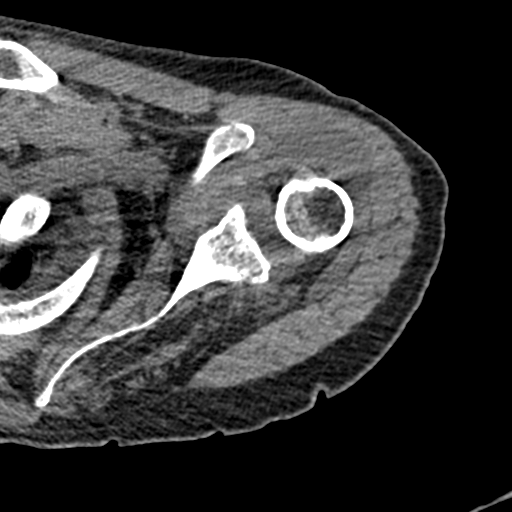
[im 122/196  bone]
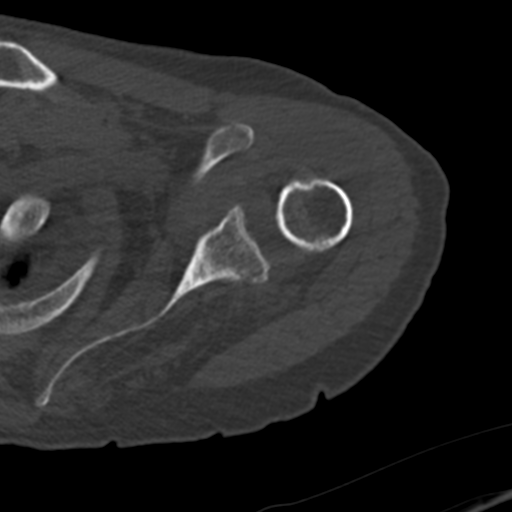
[im 147/196  bone]
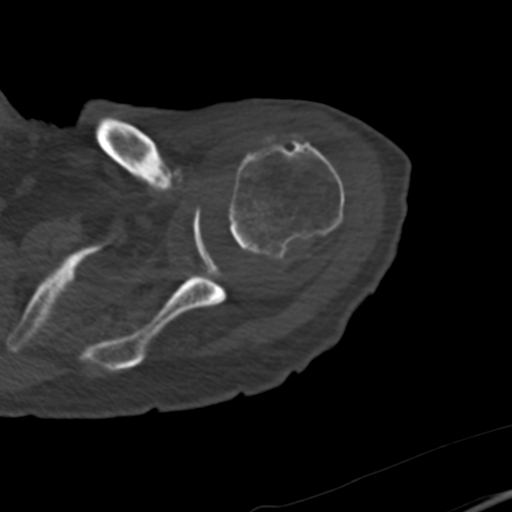
[im 171/196  bone]
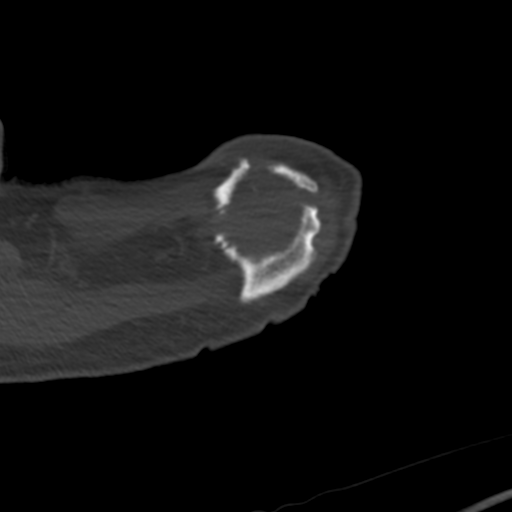

[10 of 14 positions shown; findings below may reference images not displayed]

FINDINGS: Bones/Joint/Cartilage

There is severe glenohumeral arthritis with bony remodeling. There
is concavity of the glenoid with loss of bone stock of the superior
aspect of the glenoid. Large joint effusion with some mineralization
and tiny intra-articular joint bodies. There is also bony remodeling
of the humeral head, distal clavicle, acromion, and scapular spine.
There is a fragment adjacent to the residual clavicle and scapular
spine which may be a thin residual os acromiale. The humeral head is
high riding with respect to the residual glenoid.

Ligaments

Suboptimally assessed by CT.

Muscles and Tendons

Grade 4 infraspinatus, grade 3 supraspinatus, and grade [DATE]
subscapularis muscle atrophy. Grade 4 teres minor atrophy. No
significant deltoid atrophy.

Soft tissues

There is a large amount of fluid within the subacromial-subdeltoid
bursa, coursing along the outer humerus distally.
IMPRESSION: Severe glenohumeral arthritis with bony destruction/remodeling of
the superior glenoid, humeral head, distal clavicle, and
acromion/scapular spine with probable thin residual os acromiale
present. Large joint effusion with mineralization and tiny
intra-articular joint bodies, and large amount of fluid in the
subacromial-subdeltoid bursa. Loss of superior glenoid bone stock
with some preservation of the inferior glenoid. Moderate to severe
rotator cuff muscle atrophy. Findings are consistent with a
destructive arthropathy, differential considerations include
[REDACTED] shoulder due to [HOSPITAL] deposition disease, neuropathic
arthropathy, or potentially infection.
